# Patient Record
Sex: Male | Born: 1970 | Race: Black or African American | Hispanic: No | Marital: Single | Smoking: Former smoker
Health system: Southern US, Community
[De-identification: ages and names within clinical notes are randomized; demographics above are authoritative.]

## PROBLEM LIST (undated history)

## (undated) DIAGNOSIS — Z972 Presence of dental prosthetic device (complete) (partial): Secondary | ICD-10-CM

---

## 2006-12-03 ENCOUNTER — Emergency Department: Payer: Self-pay | Admitting: Emergency Medicine

## 2011-01-01 ENCOUNTER — Emergency Department: Payer: Self-pay | Admitting: Emergency Medicine

## 2011-04-01 ENCOUNTER — Emergency Department: Payer: Self-pay | Admitting: Emergency Medicine

## 2013-12-12 ENCOUNTER — Emergency Department: Payer: Self-pay | Admitting: Emergency Medicine
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
## 2017-02-10 ENCOUNTER — Encounter: Payer: Self-pay | Admitting: Emergency Medicine

## 2017-02-10 ENCOUNTER — Ambulatory Visit
Admission: EM | Admit: 2017-02-10 | Discharge: 2017-02-10 | Disposition: A | Discharge status: Home / Self Care | Payer: 59 | Attending: Emergency Medicine | Admitting: Emergency Medicine

## 2017-02-10 DIAGNOSIS — R1010 Upper abdominal pain, unspecified: Secondary | ICD-10-CM

## 2017-02-10 DIAGNOSIS — R1011 Right upper quadrant pain: Secondary | ICD-10-CM

## 2017-02-10 MED ORDER — IBUPROFEN 600 MG PO TABS: 600.0000 mg | ORAL_TABLET | Freq: Four times a day (QID) | ORAL | 0 refills | Status: DC | PRN

## 2017-02-10 NOTE — ED Provider Notes (Signed)
HPI  SUBJECTIVE:  Samuel Davis is a 46 y.o. male who presents with 2 days of sharp, intermittent, migratory, nonradiating Right upper abdominal pain.  States while he was pushing boxes, he felt something "tear" across the upper part of his abdomen earlier today.  States that the pain got worse and now it has largely resolved.  It lasted approximately an hour.  He denies abdominal bulging, nausea, vomiting, fevers, trauma to the abdomen.  No change in his workouts/physical activity although he states he does work out daily.  He denies back, shoulder pain, chest pain, shortness of breath.  His right upper quadrant abdominal pain is not associated with eating, fasting, urination, defecation.  It is better with Tylenol.  No aggravating factors.  Upper abdominal pain is not associated, aggravated or alleviated with anything.  Last bowel movement this morning was normal.  He took Tylenol within 6-8 hours of evaluation.  No past medical history of diabetes, hypertension, abdominal surgeries, gallbladder disease, pancreatitis.  States that he drinks 2 beers a day.  No recent binging.  PMD: None.  Patient is concerned about the abdominal pain because his father had colon cancer.  History reviewed. No pertinent past medical history.  History reviewed. No pertinent surgical history.  History reviewed. No pertinent family history.  Social History  Substance Use Topics  . Smoking status: Current Every Day Smoker    Types: Cigarettes  . Smokeless tobacco: Never Used  . Alcohol use No    No current facility-administered medications for this encounter.   Current Outpatient Prescriptions:  .  ibuprofen (ADVIL,MOTRIN) 600 MG tablet, Take 1 tablet (600 mg total) by mouth every 6 (six) hours as needed., Disp: 30 tablet, Rfl: 0  Not on File   ROS  As noted in HPI.   Physical Exam  BP (!) 155/97 (BP Location: Left Arm)   Pulse 75   Temp 98.5 F (36.9 C) (Oral)   Resp 16   Ht 6\' 2"  (1.88 m)   Wt 220  lb (99.8 kg)   SpO2 99%   BMI 28.25 kg/m   Constitutional: Well developed, well nourished, no acute distress Eyes:  EOMI, conjunctiva normal bilaterally HENT: Normocephalic, atraumatic,mucus membranes moist Respiratory: Normal inspiratory effort Cardiovascular: Normal rate GI: nondistended soft, nontender, normal bowel sounds.  No guarding, rebound.  No palpable masses.  Negative McBurney, negative Murphy.  No appreciable hernias.  No evidence of abdominal trauma. Neck: No CVA tenderness skin: No rash, skin intact Musculoskeletal: no deformities Neurologic: Alert & oriented x 3, no focal neuro deficits Psychiatric: Speech and behavior appropriate   ED Course   Medications - No data to display  No orders of the defined types were placed in this encounter.   No results found for this or any previous visit (from the past 24 hour(s)). No results found.  ED Clinical Impression  Pain of upper abdomen   ED Assessment/Plan  Patient does not have any evidence for hernia.  Doubt gallbladder disease.  His abdomen is completely benign.  Suspect musculoskeletal abdominal wall pain because he does work out daily.  Advised him to rest for the next week with no core work or abs, will also start him on ibuprofen 600 mg with 1 g of Tylenol 3-4 times a day as needed for pain.  He will follow-up with a primary care physician of his choice, will provide primary care referral list.  He is to go to the ER if he gets worse.  Discussed MDM, plan  and followup with patient. Discussed sn/sx that should prompt return to the ED. patient agrees with plan.   Meds ordered this encounter  Medications  . ibuprofen (ADVIL,MOTRIN) 600 MG tablet    Sig: Take 1 tablet (600 mg total) by mouth every 6 (six) hours as needed.    Dispense:  30 tablet    Refill:  0    *This clinic note was created using L[employer]. Therefore, there may be occasional mistakes despite careful proofreading.   ?     Melynda Ripple, MD 02/10/17 [phone]

## 2017-02-10 NOTE — ED Triage Notes (Signed)
Patient states that he started having upper abdominal pain while at work today after pushing some boxes.  Patient denies N/V/D.

## 2017-02-10 NOTE — Discharge Instructions (Signed)
600 mg of ibuprofen with 1 g of Tylenol 3-4 times a day as needed.  No core/abdominal work for the next week.  Follow up with a primary care physician of your choice.  See list below.  Go to the ER for the signs and symptoms we discussed.  Here is a list of primary care providers who are taking new patients:  Dr. Otilio Miu, Dr. Adline Potter [address] [place] [address] [place] [address]  [phone]  [facility] [address] [place], [place] [address] [phone]  [facility] [place]  [phone] [place], [place] [address]  Here are clinics/ other resources who will see you if you do not have insurance. Some have certain criteria that you must meet. Call them and find out what they are:  [facility]: [address] [place], [place] [address] Phone: [phone] Hours: First and Third Saturdays of each Month, 9 a.m. - 1 p.m.  [facility]: [address], [place], [place], [place] [address] Phone: [phone] Hours: Tuesday, 4 p.m. - 8 p.m. Thursday, 1 p.m. - 8 p.m. Wednesday, 9 a.m. - [facility] [address] [place], [place] [address] Phone: [phone] Pharmacy Phone Number: [phone] Dental Phone Number: [phone] [place] Help: [phone]  Dental Hours: Monday - Thursday, 8 a.m. - 6 p.m.  [place] [address] [place], [place] [address] Phone: [phone] Pharmacy Phone Number: [phone] [facility] Insurance Help: [phone]  [facility] [address] [place], [place] [address] Phone: [phone] Pharmacy Phone Number: [phone] [facility] Insurance Help: [phone]  [facility] [address] [place], [place] [address] Phone: [phone] [facility] Insurance Help: [phone]   [place] [place], [place] [address] Phone: [phone]  Go to www.goodrx.com to look up your medications. This will give you a list of where you can find your prescriptions at the most affordable prices. Or ask the pharmacist what the cash price is, or if they have any other discount programs available to help make your medication more affordable. This can be less expensive than what you would pay with insurance.

## 2017-11-30 ENCOUNTER — Encounter: Payer: Self-pay | Admitting: Family Medicine

## 2017-11-30 ENCOUNTER — Other Ambulatory Visit: Payer: Self-pay | Admitting: Family Medicine

## 2017-11-30 ENCOUNTER — Ambulatory Visit (INDEPENDENT_AMBULATORY_CARE_PROVIDER_SITE_OTHER): Payer: BLUE CROSS/BLUE SHIELD | Admitting: Family Medicine

## 2017-11-30 VITALS — BP 126/66 | HR 65 | Temp 98.7°F | Resp 16 | Ht 74.0 in | Wt 216.4 lb

## 2017-11-30 DIAGNOSIS — Z7689 Persons encountering health services in other specified circumstances: Secondary | ICD-10-CM | POA: Diagnosis not present

## 2017-11-30 DIAGNOSIS — K625 Hemorrhage of anus and rectum: Secondary | ICD-10-CM | POA: Diagnosis not present

## 2017-11-30 DIAGNOSIS — Z131 Encounter for screening for diabetes mellitus: Secondary | ICD-10-CM

## 2017-11-30 DIAGNOSIS — Z13220 Encounter for screening for lipoid disorders: Secondary | ICD-10-CM

## 2017-11-30 DIAGNOSIS — Z0000 Encounter for general adult medical examination without abnormal findings: Secondary | ICD-10-CM

## 2017-11-30 DIAGNOSIS — Z114 Encounter for screening for human immunodeficiency virus [HIV]: Secondary | ICD-10-CM

## 2017-11-30 NOTE — Patient Instructions (Addendum)
Thank you for coming to the office today.  Keep up the good work  Follow-up as planned for further testing  I am okay to hold off on PSA prostate cancer screening for now as you mentioned the diagnosis for you father was COLON cancer.  Colon Cancer Screening: - For all adults age 46+ routine colon cancer screening is highly recommended.     - Recent guidelines from [place] recommend starting age of 81 - Early detection of colon cancer is important, because often there are no warning signs or symptoms, also if found early usually it can be cured. Late stage is hard to treat. - Special circumstances in patients with early family history of colon cancer, we recommend colonoscopy at a younger age (usually 63 years before the family member was diagnosed).  - Colonoscopy is the best test for colon cancer because it involves direct visualization and immediate treatment (compared to other imaging studies or stool cards to test for blood, that will require you to eventually get a colonoscopy if they are abnormal).   DUE for FASTING BLOOD WORK (no food or drink after midnight before the lab appointment, only water or coffee without cream/sugar on the morning of)  SCHEDULE "Lab Only" visit in the morning at the clinic for lab draw within 1 week  - Make sure Lab Only appointment is at about 1 week before your next appointment, so that results will be available  For Lab Results, once available within 2-3 days of blood draw, you can can log in to MyChart online to view your results and a brief explanation. Also, we can discuss results at next follow-up visit.   Please schedule a Follow-up Appointment to: Return in about 1 week (around 12/07/2017) for Annual Physical (+DRE).  If you have any other questions or concerns, please feel free to call the office or send a message through [facility]. You may also schedule an earlier appointment if necessary.  Additionally, you may be receiving a  survey about your experience at our office within a few days to 1 week by e-mail or mail. We value your feedback.  Nobie Putnam, DO [place]

## 2017-11-30 NOTE — Progress Notes (Signed)
Subjective:    Patient ID: Samuel Davis, male    DOB: December 22, 1970, 47 y.o.   MRN: [id number]  Samuel Davis is a 47 y.o. male presenting on 11/30/2017 for Establish Care (blood in stool onset 4 days) and Hematochezia  Here to establish care with new PCP. No prior PCP for while before.  HPI   Rectal Bleeding / Hematochezia Reports prior episode in past that resolved spontaneously, and now he had new recurrent episode about 2 weeks ago, he had some stomach ache and discomfort and he had noticed some blood in toilet, decent amount of bright red blood in water not mixed in with stool, lasted for 2-3 days with every bowel movement until it eased. Most bowel movements were loose. He does not endorse significant concerns with constipation with hard or dry stool or straining. 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 Currently asymptomatic. No known family history of prostate CA. Defer screening  Colon CA Screening: Never had colonoscopy. Currently w/ hematochezia, see above. known family history of colon CA, father age 55s. Recommended pursuing further exam and screening, he will reconsider. Declines today.   Depression screen [facility] 2/9 11/30/2017  Decreased Interest 0  Down, Depressed, Hopeless 0  PHQ - 2 Score 0    History reviewed. No pertinent past medical history. History reviewed. No  pertinent surgical history. Social History   Socioeconomic History  . Marital status: Single    Spouse name: Not on file  . Number of children: Not on file  . Years of education: [place], [place]  . Highest education level: Associate degree: occupational, Hotel manager, or vocational program  Occupational History  . Occupation: Distribution  Social Needs  . Financial resource strain: Not on file  . Food insecurity:    Worry: Not on file    Inability: Not on file  . Transportation needs:    Medical: Not on file    Non-medical: Not on file  Tobacco Use  . Smoking status: Former Smoker    Packs/day: 0.75    Years: 10.00    Pack years: 7.50    Types: Cigarettes    Last attempt to quit: 09/08/2017    Years since quitting: 0.2  . Smokeless tobacco: Former Systems developer  . Tobacco comment: Quit cold [place]  Substance and Sexual Activity  . Alcohol use: Yes    Alcohol/week: 5.0 standard drinks    Types: 5 Cans of beer per week  . Drug use: Never  . Sexual activity: Not on file  Lifestyle  . Physical activity:    Days per week: Not on file    Minutes per session: Not on file  . Stress: Not on file  Relationships  . Social connections:    Talks on phone: Not on file    Gets together: Not on file    Attends religious service: Not on file    Active member of club or organization: Not on file  Attends meetings of clubs or organizations: Not on file    Relationship status: Not on file  . Intimate partner violence:    Fear of current or ex partner: Not on file    Emotionally abused: Not on file    Physically abused: Not on file    Forced sexual activity: Not on file  Other Topics Concern  . Not on file  Social History Narrative  . Not on file   Family History  Problem Relation Age of Onset  . Colon cancer Father 70       possibly earlier, but not diagnosed  . Hypertension Sister    Current Outpatient Medications on File Prior to Visit  Medication Sig  . ibuprofen (ADVIL,MOTRIN) 600  MG tablet Take 1 tablet (600 mg total) by mouth every 6 (six) hours as needed.   No current facility-administered medications on file prior to visit.     Review of Systems Per HPI unless specifically indicated above     Objective:    BP 126/66   Pulse 65   Temp 98.7 F (37.1 C) (Oral)   Resp 16   Ht 6\' 2"  (1.88 m)   Wt 216 lb 6.4 oz (98.2 kg)   BMI 27.78 kg/m   Wt Readings from Last 3 Encounters:  11/30/17 216 lb 6.4 oz (98.2 kg)  02/10/17 220 lb (99.8 kg)    Physical Exam  Constitutional: He is oriented to person, place, and time. He appears well-developed and well-nourished. No distress.  Well-appearing, comfortable, cooperative  HENT:  Head: Normocephalic and atraumatic.  Mouth/Throat: Oropharynx is clear and moist.  Eyes: Conjunctivae are normal. Right eye exhibits no discharge. Left eye exhibits no discharge.  Cardiovascular: Normal rate.  Pulmonary/Chest: Effort normal.  Genitourinary:  Genitourinary Comments: Declined DRE today  Musculoskeletal: He exhibits no edema.  Neurological: He is alert and oriented to person, place, and time.  Skin: Skin is warm and dry. No rash noted. He is not diaphoretic. No erythema.  Psychiatric: He has a normal mood and affect. His behavior is normal.  Well groomed, good eye contact, normal speech and thoughts  Nursing note and vitals reviewed.  No results found for this or any previous visit.    Assessment & Plan:   Problem List Items Addressed This Visit    None    Visit Diagnoses    Rectal bleeding    -  Primary  Clinically with hematochezia / BRBPR episode now lasting few days and since resolved, without significant other GI red flags or other assoc symptoms. Prior episode in past that is resolved. - Remains uncertain diagnosis. Still most likely is hemorrhoidal bleeding possibly internal given lack of abdominal pain and constipation. - Concern fam history of colon cancer age 29 father, at risk until proven  otherwise  Plan Recommended pursuing further eval - DRE today but he declined, reconsider in 1 week at physical - Also recommend pursuing colon cancer screening given his fam history, prefer refer to GI for colonoscopy, he will reconsider as well, remains interested but not ready today to pursue - Follow-up if worsening or new symptoms    Encounter to establish care with new doctor          No orders of the defined types were placed in this encounter.   Follow up plan: Return in about 1 week (around 12/07/2017) for Annual Physical (+DRE).   Future labs ordered for 12/07/17, he will return same day for physical.  Nobie Putnam, DO  [facility] 11/30/2017, 10:52 PM

## 2017-12-06 ENCOUNTER — Other Ambulatory Visit: Payer: Self-pay

## 2017-12-06 DIAGNOSIS — Z13220 Encounter for screening for lipoid disorders: Secondary | ICD-10-CM

## 2017-12-06 DIAGNOSIS — Z0000 Encounter for general adult medical examination without abnormal findings: Secondary | ICD-10-CM

## 2017-12-06 DIAGNOSIS — K625 Hemorrhage of anus and rectum: Secondary | ICD-10-CM

## 2017-12-06 DIAGNOSIS — Z114 Encounter for screening for human immunodeficiency virus [HIV]: Secondary | ICD-10-CM

## 2017-12-06 DIAGNOSIS — Z131 Encounter for screening for diabetes mellitus: Secondary | ICD-10-CM

## 2017-12-07 ENCOUNTER — Ambulatory Visit (INDEPENDENT_AMBULATORY_CARE_PROVIDER_SITE_OTHER): Payer: BLUE CROSS/BLUE SHIELD | Admitting: Family Medicine

## 2017-12-07 ENCOUNTER — Encounter: Payer: Self-pay | Admitting: Family Medicine

## 2017-12-07 ENCOUNTER — Other Ambulatory Visit: Payer: BLUE CROSS/BLUE SHIELD

## 2017-12-07 ENCOUNTER — Other Ambulatory Visit: Payer: Self-pay | Admitting: Family Medicine

## 2017-12-07 VITALS — BP 130/59 | HR 61 | Temp 98.0°F | Resp 16 | Ht 74.0 in | Wt 216.0 lb

## 2017-12-07 DIAGNOSIS — K644 Residual hemorrhoidal skin tags: Secondary | ICD-10-CM

## 2017-12-07 DIAGNOSIS — Z0000 Encounter for general adult medical examination without abnormal findings: Secondary | ICD-10-CM | POA: Diagnosis not present

## 2017-12-07 NOTE — Progress Notes (Signed)
Subjective:    Patient ID: Samuel Davis, male    DOB: 08/06/1970, 47 y.o.   MRN: [id number]  Samuel Davis is a 47 y.o. male presenting on 12/07/2017 for Annual Exam   HPI   Here for Annual Physical, he has had fasting lab draw completed today. Lab results are still pending.  Lifestyle Diet: Tries to eat balanced, fruit / vegetable daily, drinks only water and some juice, not drinking soda Exercise: Gym does workout everyday with cardio, chest, legs, abs etc He is sleeping well, no concerns.  FOLLOW-UP Rectal Bleeding / External Hemorrhoid Last visit discussed this problem, prior episode few weeks ago, since resolved. No prior diagnosis. He states has not had any further bleeding episodes since that time. He does not endorse significant concerns with constipation with hard or dry stool or straining. - Family history of colon cancer, father age 86s  Lifestyle Diet: Tries to eat balanced, fruit / vegetable daily, drinks only water and some juice, not drinking soda Exercise: Gym does workout everyday with cardio, chest, legs, abs etc   Health Maintenance:  Prostate CA Screening: No prior prostate CA screening. Currently asymptomatic. No known family history of prostate CA. Defer screening at this time.  Colon CA Screening: Never had colonoscopy. Currently w/ hematochezia, see above. known family history of colon CA, father age 33s. Recommended pursuing further exam and screening, he will reconsider. Declines today.  Due for Flu Vaccine, will return when in stock  Depression screen [facility] 2/9 12/07/2017 11/30/2017  Decreased Interest 0 0  Down, Depressed, Hopeless 0 0  PHQ - 2 Score 0 0    History reviewed. No pertinent past medical history. History reviewed. No pertinent surgical history. Social History   Socioeconomic History  . Marital status: Single    Spouse name: Not on file  . Number of children: Not on file  . Years of education: [place], [place]  . Highest education  level: Associate degree: occupational, Hotel manager, or vocational program  Occupational History  . Occupation: Distribution  Social Needs  . Financial resource strain: Not on file  . Food insecurity:    Worry: Not on file    Inability: Not on file  . Transportation needs:    Medical: Not on file    Non-medical: Not on file  Tobacco Use  . Smoking status: Former Smoker    Packs/day: 0.75    Years: 10.00    Pack years: 7.50    Types: Cigarettes    Last attempt to quit: 09/08/2017    Years since quitting: 0.2  . Smokeless tobacco: Former Systems developer  . Tobacco comment: Quit cold [place]  Substance and Sexual Activity  . Alcohol use: Yes    Alcohol/week: 5.0 standard drinks    Types: 5 Cans of beer per week  . Drug use: Never  . Sexual activity: Not on file  Lifestyle  . Physical activity:    Days per week: Not on file    Minutes per session: Not on file  . Stress: Not on file  Relationships  . Social connections:    Talks on phone: Not on file    Gets together: Not on file    Attends religious service: Not on file    Active member of club or organization: Not on file    Attends meetings of clubs or organizations: Not on file    Relationship status: Not on file  . Intimate partner violence:    Fear of current or ex partner: Not  on file    Emotionally abused: Not on file    Physically abused: Not on file    Forced sexual activity: Not on file  Other Topics Concern  . Not on file  Social History Narrative  . Not on file   Family History  Problem Relation Age of Onset  . Colon cancer Father 61       possibly earlier, but not diagnosed  . Hypertension Sister    Current Outpatient Medications on File Prior to Visit  Medication Sig  . ibuprofen (ADVIL,MOTRIN) 600 MG tablet Take 1 tablet (600 mg total) by mouth every 6 (six) hours as needed.   No current facility-administered medications on file prior to visit.     Review of Systems Per HPI unless specifically indicated  above     Objective:    BP (!) 130/59   Pulse 61   Temp 98 F (36.7 C) (Oral)   Resp 16   Ht 6\' 2"  (1.88 m)   Wt 216 lb (98 kg)   BMI 27.73 kg/m   Wt Readings from Last 3 Encounters:  12/07/17 216 lb (98 kg)  11/30/17 216 lb 6.4 oz (98.2 kg)  02/10/17 220 lb (99.8 kg)    Physical Exam  Constitutional: He is oriented to person, place, and time. He appears well-developed and well-nourished. No distress.  Well-appearing, comfortable, cooperative, muscular build  HENT:  Head: Normocephalic and atraumatic.  Mouth/Throat: Oropharynx is clear and moist.  Frontal / maxillary sinuses non-tender. Nares patent without purulence or edema. Bilateral TMs clear without erythema, effusion or bulging. Oropharynx clear without erythema, exudates, edema or asymmetry.  Eyes: Pupils are equal, round, and reactive to light. Conjunctivae and EOM are normal. Right eye exhibits no discharge. Left eye exhibits no discharge.  Neck: Normal range of motion. Neck supple. No thyromegaly present.  Cardiovascular: Normal rate, regular rhythm, normal heart sounds and intact distal pulses.  No murmur heard. Pulmonary/Chest: Effort normal and breath sounds normal. No respiratory distress. He has no wheezes. He has no rales.  Abdominal: Soft. Bowel sounds are normal. He exhibits no distension and no mass. There is no tenderness.  Genitourinary:  Genitourinary Comments: Rectal: External exam with notable non inflamed non swollen external hemorrhoidal tissue moderately sized posterior aspect, without obvious anal fissure, no active bleeding. Did not pursue DRE today based on external findings and patient preference.  Musculoskeletal: Normal range of motion. He exhibits no edema or tenderness.  Upper / Lower Extremities: - Normal muscle tone, strength bilateral upper extremities 5/5, lower extremities 5/5  Lymphadenopathy:    He has no cervical adenopathy.  Neurological: He is alert and oriented to person, place, and  time.  Distal sensation intact to light touch all extremities  Skin: Skin is warm and dry. No rash noted. He is not diaphoretic. No erythema.  Psychiatric: He has a normal mood and affect. His behavior is normal.  Well groomed, good eye contact, normal speech and thoughts  Nursing note and vitals reviewed.  No results found for this or any previous visit.    Assessment & Plan:   Problem List Items Addressed This Visit    External hemorrhoids without complication    Stable chronic external posterior hemorrhoid. Uncomplicated. Not inflamed or thrombosed. Likely source of his rectal bleeding episode in past. Declined DRE - uncertain if also internal hemorrhoids. No evidence of anal fissure or any perineal problems, no sign of erythema or cellulitis.  Plan: 1. Reassurance 2. Recommend to avoid constipation  and straining, recommend high fiber diet, improve hydration 3. Still advise that he should pursue routine colon cancer screening age appropriate, this does not limit that  Reviewed return criteria if not improving, also advised that if significant worsening given location and size of hemorrhoidal tissue, may need General Surgery evaluation and management       Other Visit Diagnoses    Annual physical exam    -  Primary    Updated Health Maintenance information - Return for Flu vaccine Reviewed recent lab results with patient Encouraged improvement to lifestyle with diet and exercise  Pending lab result review will release to patient.  No orders of the defined types were placed in this encounter.   Follow up plan: Return in about 1 year (around 12/08/2018) for Annual Physical.  Future labs ordered for 12/06/18  Nobie Putnam, [facility] 12/07/2017, 1:01 PM

## 2017-12-07 NOTE — Assessment & Plan Note (Signed)
Stable chronic external posterior hemorrhoid. Uncomplicated. Not inflamed or thrombosed. Likely source of his rectal bleeding episode in past. Declined DRE - uncertain if also internal hemorrhoids. No evidence of anal fissure or any perineal problems, no sign of erythema or cellulitis.  Plan: 1. Reassurance 2. Recommend to avoid constipation and straining, recommend high fiber diet, improve hydration 3. Still advise that he should pursue routine colon cancer screening age appropriate, this does not limit that  Reviewed return criteria if not improving, also advised that if significant worsening given location and size of hemorrhoidal tissue, may need General Surgery evaluation and management

## 2017-12-07 NOTE — Patient Instructions (Addendum)
Thank you for coming to the office today.  Please schedule and return for a NURSE ONLY VISIT for VACCINE - Approximately around October 2019 - Need Flu Vaccine  You have an external hemorrhoid, which involves swollen veins on your rectum - if it flares up it can be very sensitive and causes your severe pain. You may experience worsening pain and bleeding with bright red blood if the hemorrhoid develops a superficial blood clot. Also you may have deeper internal hemorrhoids that can cause bleeding without as much pain.  Try high fiber diet to prevent and may try metamucil or fiber gummy supplement to avoid constipation and straining to avoid flare up of hemorrhoid  If you need medicine to calm down pain, let me know or come back as soon as it develops - May recommend suppository for pain relief or OTC medications such as Preparation H or Witch Hazel - Try the warm bathtub soak 1-2 times daily for next week if you can, or can try the [facility] for just your bottom - Try to stay well hydrated, avoid constipation and straining, eat a high fiber diet  If you get significant worsening pain, rectal bleeding, or not responding to treatment, please notify our office and we will anticipate on an urgent referral to General Surgery office as you may need an office procedure to resolve hemorrhoidal tissue, this is most successful if it is early in the course within 24-72 hours of acute worsening pain and bleeding.  Stay tuned for lab results next week. I will be out of office on Tuesday, if question can call.  DUE for FASTING BLOOD WORK (no food or drink after midnight before the lab appointment, only water or coffee without cream/sugar on the morning of)  SCHEDULE "Lab Only" visit in the morning at the clinic for lab draw in 1 YEAR  - Make sure Lab Only appointment is at about 1 week before your next appointment, so that results will be available  For Lab Results, once available within 2-3 days of  blood draw, you can can log in to MyChart online to view your results and a brief explanation. Also, we can discuss results at next follow-up visit.  Regardless of hemorrhoid.  Colon Cancer Screening: REGARDLESS OF HEMORRHOID - I still recommend colon cancer screening between now and age 51 - For all adults age 65+ routine colon cancer screening is highly recommended.     - Recent guidelines from [place] recommend starting age of 37 - Early detection of colon cancer is important, because often there are no warning signs or symptoms, also if found early usually it can be cured. Late stage is hard to treat.  Please schedule a Follow-up Appointment to: Return in about 1 year (around 12/08/2018) for Annual Physical.  If you have any other questions or concerns, please feel free to call the office or send a message through [place]. You may also schedule an earlier appointment if necessary.  Additionally, you may be receiving a survey about your experience at our office within a few days to 1 week by e-mail or mail. We value your feedback.  Nobie Putnam, DO [facility], [facility]   High-Fiber Diet Fiber, also called dietary fiber, is a type of carbohydrate found in fruits, vegetables, whole grains, and beans. A high-fiber diet can have many health benefits. Your health care provider may recommend a high-fiber diet to help:  Prevent constipation. Fiber can make your bowel movements more regular.  Lower your cholesterol.  Relieve hemorrhoids, uncomplicated diverticulosis, or irritable bowel syndrome.  Prevent overeating as part of a weight-loss plan.  Prevent heart disease, type 2 diabetes, and certain cancers.  What is my plan? The recommended daily intake of fiber includes:  38 grams for men under age 3.  6 grams for men over age 52.  78 grams for women under age 50.  75 grams for women over age 23.  You can get the recommended daily intake  of dietary fiber by eating a variety of fruits, vegetables, grains, and beans. Your health care provider may also recommend a fiber supplement if it is not possible to get enough fiber through your diet. What do I need to know about a high-fiber diet?  Fiber supplements have not been widely studied for their effectiveness, so it is better to get fiber through food sources.  Always check the fiber content on thenutrition facts label of any prepackaged food. Look for foods that contain at least 5 grams of fiber per serving.  Ask your dietitian if you have questions about specific foods that are related to your condition, especially if those foods are not listed in the following section.  Increase your daily fiber consumption gradually. Increasing your intake of dietary fiber too quickly may cause bloating, cramping, or gas.  Drink plenty of water. Water helps you to digest fiber. What foods can I eat? Grains Whole-grain breads. Multigrain cereal. Oats and oatmeal. Brown rice. Barley. Bulgur wheat. [place]. Bran muffins. Popcorn. Rye wafer crackers. Vegetables Sweet potatoes. Spinach. Kale. Artichokes. Cabbage. Broccoli. Green peas. Carrots. Squash. Fruits Berries. Pears. Apples. Oranges. Avocados. Prunes and raisins. Dried figs. Meats and Other Protein Sources [place], kidney, pinto, and soy beans. Split peas. Lentils. Nuts and seeds. Dairy Fiber-fortified yogurt. Beverages Fiber-fortified soy milk. Fiber-fortified orange juice. Other Fiber bars. The items listed above may not be a complete list of recommended foods or beverages. Contact your dietitian for more options. What foods are not recommended? Grains White bread. Pasta made with refined flour. White rice. Vegetables Fried potatoes. Canned vegetables. Well-cooked vegetables. Fruits Fruit juice. Cooked, strained fruit. Meats and Other Protein Sources Fatty cuts of meat. Fried Sales executive or fried fish. Dairy Milk. Yogurt. Cream  cheese. Sour cream. Beverages Soft drinks. Other Cakes and pastries. Butter and oils. The items listed above may not be a complete list of foods and beverages to avoid. Contact your dietitian for more information. What are some tips for including high-fiber foods in my diet?  Eat a wide variety of high-fiber foods.  Make sure that half of all grains consumed each day are whole grains.  Replace breads and cereals made from refined flour or white flour with whole-grain breads and cereals.  Replace white rice with brown rice, bulgur wheat, or millet.  Start the day with a breakfast that is high in fiber, such as a cereal that contains at least 5 grams of fiber per serving.  Use beans in place of meat in soups, salads, or pasta.  Eat high-fiber snacks, such as berries, raw vegetables, nuts, or popcorn. This information is not intended to replace advice given to you by your health care provider. Make sure you discuss any questions you have with your health care provider. Document Released: 03/27/2005 Document Revised: 09/02/2015 Document Reviewed: 09/09/2013 Elsevier Interactive Patient Education  [employer].

## 2017-12-08 LAB — COMPLETE METABOLIC PANEL WITH GFR
AG Ratio: 2.1 (calc) (ref 1.0–2.5)
ALBUMIN MSPROF: 4.5 g/dL (ref 3.6–5.1)
ALKALINE PHOSPHATASE (APISO): 42 U/L (ref 40–115)
ALT: 32 U/L (ref 9–46)
AST: 33 U/L (ref 10–40)
BUN: 17 mg/dL (ref 7–25)
CO2: 27 mmol/L (ref 20–32)
CREATININE: 0.98 mg/dL (ref 0.60–1.35)
Calcium: 9.3 mg/dL (ref 8.6–10.3)
Chloride: 104 mmol/L (ref 98–110)
GFR, EST AFRICAN AMERICAN: 107 mL/min/{1.73_m2} (ref >=60)
GFR, Est Non African American: 92 mL/min/{1.73_m2} (ref >=60)
GLUCOSE: 108 mg/dL — AB (ref 65–99)
Globulin: 2.1 g/dL (calc) (ref 1.9–3.7)
Potassium: 4.4 mmol/L (ref 3.5–5.3)
Sodium: 138 mmol/L (ref 135–146)
TOTAL PROTEIN: 6.6 g/dL (ref 6.1–8.1)
Total Bilirubin: 0.5 mg/dL (ref 0.2–1.2)

## 2017-12-08 LAB — CBC WITH DIFFERENTIAL/PLATELET
BASOS ABS: 32 {cells}/uL (ref 0–200)
Basophils Relative: 0.9 %
EOS ABS: 172 {cells}/uL (ref 15–500)
Eosinophils Relative: 4.9 %
HCT: 41.6 % (ref 38.5–50.0)
Hemoglobin: 12.5 g/dL — ABNORMAL LOW (ref 13.2–17.1)
Lymphs Abs: 1362 cells/uL (ref 850–3900)
MCH: 23.2 pg — AB (ref 27.0–33.0)
MCHC: 30 g/dL — AB (ref 32.0–36.0)
MCV: 77.3 fL — AB (ref 80.0–100.0)
MONOS PCT: 8.4 %
NEUTROS PCT: 46.9 %
Neutro Abs: 1642 cells/uL (ref 1500–7800)
RBC: 5.38 10*6/uL (ref 4.20–5.80)
RDW: 15.1 % — AB (ref 11.0–15.0)
TOTAL LYMPHOCYTE: 38.9 %
WBC mixed population: 294 cells/uL (ref 200–950)
WBC: 3.5 10*3/uL — ABNORMAL LOW (ref 3.8–10.8)

## 2017-12-08 LAB — HEMOGLOBIN A1C
Hgb A1c MFr Bld: 5.7 % of total Hgb — ABNORMAL HIGH (ref <5.7)
Mean Plasma Glucose: 117 (calc)
eAG (mmol/L): 6.5 (calc)

## 2017-12-08 LAB — LIPID PANEL
Cholesterol: 254 mg/dL — ABNORMAL HIGH (ref <200)
HDL: 81 mg/dL (ref >40)
LDL Cholesterol (Calc): 160 mg/dL (calc) — ABNORMAL HIGH
Non-HDL Cholesterol (Calc): 173 mg/dL (calc) — ABNORMAL HIGH (ref <130)
Total CHOL/HDL Ratio: 3.1 (calc) (ref <5.0)
Triglycerides: 42 mg/dL (ref <150)

## 2017-12-08 LAB — HIV ANTIBODY (ROUTINE TESTING W REFLEX): HIV 1&2 Ab, 4th Generation: NONREACTIVE

## 2017-12-12 ENCOUNTER — Encounter: Payer: Self-pay | Admitting: Family Medicine

## 2017-12-12 ENCOUNTER — Telehealth: Payer: Self-pay | Admitting: Family Medicine

## 2017-12-12 NOTE — Telephone Encounter (Signed)
Called patient to review lab results.  He will call us to schedule for 6 months for PreDM A1c check and lifestyle.  He requested mail copy of diet information for pre-diabetes.  He thinks may have had history of low iron before but not sure, no available lab test results from past. He is asymptomatic from this and not endorsing other concerns, he agrees to monitor this as planned and re-check in 1 year. Sooner if symptoms of worsening anemia or bleeding or other concerns.  Nobie Putnam, DO [facility] 12/12/2017, 5:44 PM

## 2017-12-12 NOTE — Telephone Encounter (Signed)
Pt called for labs results. Pt call back # is [phone]

## 2017-12-28 ENCOUNTER — Telehealth: Payer: Self-pay | Admitting: Family Medicine

## 2017-12-31 ENCOUNTER — Ambulatory Visit (INDEPENDENT_AMBULATORY_CARE_PROVIDER_SITE_OTHER): Payer: BLUE CROSS/BLUE SHIELD

## 2017-12-31 DIAGNOSIS — Z23 Encounter for immunization: Secondary | ICD-10-CM | POA: Diagnosis not present

## 2018-01-01 NOTE — Telephone Encounter (Signed)
error 

## 2018-02-06 ENCOUNTER — Encounter: Payer: Self-pay | Admitting: Family Medicine

## 2018-02-06 ENCOUNTER — Ambulatory Visit (INDEPENDENT_AMBULATORY_CARE_PROVIDER_SITE_OTHER): Payer: BLUE CROSS/BLUE SHIELD | Admitting: Family Medicine

## 2018-02-06 VITALS — BP 146/88 | HR 77 | Temp 98.6°F | Resp 16 | Ht 74.0 in | Wt 210.0 lb

## 2018-02-06 DIAGNOSIS — L509 Urticaria, unspecified: Secondary | ICD-10-CM

## 2018-02-06 MED ORDER — TRIAMCINOLONE ACETONIDE 0.1 % EX CREA: 1.0000 "application " | TOPICAL_CREAM | Freq: Two times a day (BID) | CUTANEOUS | 0 refills | Status: DC | PRN

## 2018-02-06 NOTE — Progress Notes (Signed)
Subjective:    Patient ID: Samuel Davis, male    DOB: [birth year], 47 y.o.   MRN: [id number]  Samuel Davis is a 47 y.o. male presenting on 02/06/2018 for Mass (as per patient more on abdominal LLQ onset 2 week)   HPI   LLQ Abdominal Subcutaneous Nodule Reports symptoms onset about 2 weeks ago, with thought he had a "bump" on skin and it was itching and he noticed a deeper "nodule" and wanted to get it checked out. Recent history exposure to bedbugs at friend's house he went to [facility] 2-3 weeks ago and was treated with antibiotic injection, and had some hive reaction, caused similar areas of swelling - He denies any actual abdominal symptoms, no abdominal pain, nausea vomiting, bowel changes, blood in stool or hematochezia episodes - No other mass or lipoma diagnosed before - He thinks the spot is improving now, he has not had any more problems with bug bites and itching has resolved - He does request an anti itch or topical cream Denies any fevers chills unintentional weight loss  Health Maintenance: UTD Flu Vaccine  Depression screen [facility] 2/9 02/06/2018 12/07/2017 11/30/2017  Decreased Interest 0 0 0  Down, Depressed, Hopeless 0 0 0  PHQ - 2 Score 0 0 0    Social History   Tobacco Use  . Smoking status: Former Smoker    Packs/day: 0.75    Years: 10.00    Pack years: 7.50    Types: Cigarettes    Last attempt to quit: 09/08/2017    Years since quitting: 0.4  . Smokeless tobacco: Former Systems developer  . Tobacco comment: Quit cold [place]  Substance Use Topics  . Alcohol use: Yes    Alcohol/week: 5.0 standard drinks    Types: 5 Cans of beer per week  . Drug use: Never    Review of Systems Per HPI unless specifically indicated above     Objective:    BP (!) 146/88   Pulse 77   Temp 98.6 F (37 C) (Oral)   Resp 16   Ht 6\' 2"  (1.88 m)   Wt 210 lb (95.3 kg)   BMI 26.96 kg/m   Wt Readings from Last 3 Encounters:  02/06/18 210 lb (95.3 kg)  12/07/17 216 lb (98 kg)  11/30/17  216 lb 6.4 oz (98.2 kg)    Physical Exam  Constitutional: He is oriented to person, place, and time. He appears well-developed and well-nourished. No distress.  Well-appearing, comfortable, cooperative  HENT:  Head: Normocephalic and atraumatic.  Mouth/Throat: Oropharynx is clear and moist.  Eyes: Conjunctivae are normal. Right eye exhibits no discharge. Left eye exhibits no discharge.  Cardiovascular: Normal rate.  Pulmonary/Chest: Effort normal.  Abdominal: Soft. Bowel sounds are normal. He exhibits mass (small palpable < 1 cm nodular density appears within subcutaneous tissue of left lower region of abdomen, minimal tender no skin involvement). He exhibits no distension. There is no tenderness. There is no guarding.  Musculoskeletal: He exhibits no edema.  Neurological: He is alert and oriented to person, place, and time.  Skin: Skin is warm and dry. No rash (Resolved - one area of hyperpigmentation R side from prior bite) noted. He is not diaphoretic. No erythema.  Psychiatric: He has a normal mood and affect. His behavior is normal.  Well groomed, good eye contact, normal speech and thoughts  Nursing note and vitals reviewed.         Assessment & Plan:   Problem List Items Addressed This  Visit    None    Visit Diagnoses    Urticarial rash    -  Primary   Relevant Medications   triamcinolone cream (KENALOG) 0.1 %      Clinically with recent urticarial rash from suspected bug bites some on abdomen, he has scratched area and seems to have triggered persistent local swelling reaction, now improved and only some residual deeper palpable nodular density.   Plan Reassurance, does not feel like concerning abdominal mass at this time. Seems to be limited to subcutaneous tissue. Trial of Triamcinolone to see if reduce local skin reaction from previous hive if that helps reduce this spot Follow-up progress if continues to improve, then no further treatment If not improving then may  consider return for re-evaluation, possible ultrasound imaging  Meds ordered this encounter  Medications  . triamcinolone cream (KENALOG) 0.1 %    Sig: Apply 1 application topically 2 (two) times daily as needed.    Dispense:  30 g    Refill:  0    Follow up plan: Return if symptoms worsen or fail to improve, for  skin reaction.  Nobie Putnam, DO [facility] 02/06/2018, 4:35 PM

## 2018-02-06 NOTE — Patient Instructions (Addendum)
Thank you for coming to the office today.  Try Triamcinolone cream 1-2 times a day for 1-2 weeks - use the topical steroid as a spot treatment, help reduce itch, swelling, local reaction, should help it resolve.  If it is not 100% resolved but improving, then no need to do anything else at this time.  May have been an already existing Lipoma or other benign or normal growth within this layer of the skin.  If worsening - inc size, change in shape, other spots, redness, worse pain, fevers or other unusual symptoms then let me know.   Please schedule a Follow-up Appointment to: Return if symptoms worsen or fail to improve, for  skin reaction.  If you have any other questions or concerns, please feel free to call the office or send a message through [place]. You may also schedule an earlier appointment if necessary.  Additionally, you may be receiving a survey about your experience at our office within a few days to 1 week by e-mail or mail. We value your feedback.  Nobie Putnam, DO [place]

## 2018-02-07 ENCOUNTER — Encounter: Payer: Self-pay | Admitting: Family Medicine

## 2018-05-04 ENCOUNTER — Emergency Department: Payer: BLUE CROSS/BLUE SHIELD

## 2018-05-04 ENCOUNTER — Emergency Department
Admission: EM | Admit: 2018-05-04 | Discharge: 2018-05-04 | Disposition: A | Discharge status: Home / Self Care | Payer: BLUE CROSS/BLUE SHIELD | Attending: Emergency Medicine | Admitting: Emergency Medicine

## 2018-05-04 ENCOUNTER — Other Ambulatory Visit: Payer: Self-pay

## 2018-05-04 ENCOUNTER — Encounter: Payer: Self-pay | Admitting: Emergency Medicine

## 2018-05-04 DIAGNOSIS — S62221A Displaced Rolando's fracture, right hand, initial encounter for closed fracture: Secondary | ICD-10-CM | POA: Insufficient documentation

## 2018-05-04 DIAGNOSIS — Y939 Activity, unspecified: Secondary | ICD-10-CM | POA: Insufficient documentation

## 2018-05-04 DIAGNOSIS — S66801A Unspecified injury of other specified muscles, fascia and tendons at wrist and hand level, right hand, initial encounter: Secondary | ICD-10-CM | POA: Diagnosis not present

## 2018-05-04 DIAGNOSIS — Y999 Unspecified external cause status: Secondary | ICD-10-CM | POA: Diagnosis not present

## 2018-05-04 DIAGNOSIS — Y929 Unspecified place or not applicable: Secondary | ICD-10-CM | POA: Diagnosis not present

## 2018-05-04 DIAGNOSIS — Z79899 Other long term (current) drug therapy: Secondary | ICD-10-CM | POA: Insufficient documentation

## 2018-05-04 DIAGNOSIS — S62171A Displaced fracture of trapezium [larger multangular], right wrist, initial encounter for closed fracture: Secondary | ICD-10-CM | POA: Insufficient documentation

## 2018-05-04 DIAGNOSIS — S6991XA Unspecified injury of right wrist, hand and finger(s), initial encounter: Secondary | ICD-10-CM | POA: Diagnosis present

## 2018-05-04 DIAGNOSIS — Z87891 Personal history of nicotine dependence: Secondary | ICD-10-CM | POA: Insufficient documentation

## 2018-05-04 DIAGNOSIS — X58XXXA Exposure to other specified factors, initial encounter: Secondary | ICD-10-CM | POA: Diagnosis not present

## 2018-05-04 MED ORDER — TRAMADOL HCL 50 MG PO TABS: 50.0000 mg | ORAL_TABLET | Freq: Four times a day (QID) | ORAL | 0 refills | Status: DC | PRN

## 2018-05-04 MED ORDER — MELOXICAM 15 MG PO TABS: 15.0000 mg | ORAL_TABLET | Freq: Every day | ORAL | 0 refills | Status: DC

## 2018-05-04 NOTE — ED Provider Notes (Signed)
[facility] Emergency Department Provider Note ____________________________________________  Time seen: Approximately 11:00 AM  I have reviewed the triage vital signs and the nursing notes.   HISTORY  Chief Complaint Hand Pain    HPI Samuel Davis is a 48 y.o. male who presents to the emergency department for evaluation and treatment of right hand pain after injury yesterday. Very little details provided other than he "smashed it." Pain and swelling have increased overnight. No alleviating measures attempted prior to arrival.  History reviewed. No pertinent past medical history.  Patient Active Problem List   Diagnosis Date Noted  . External hemorrhoids without complication 81/44/8185    History reviewed. No pertinent surgical history.  Prior to Admission medications   Medication Sig Start Date End Date Taking? Authorizing Provider  ibuprofen (ADVIL,MOTRIN) 600 MG tablet Take 1 tablet (600 mg total) by mouth every 6 (six) hours as needed. 02/10/17   Melynda Ripple, MD  meloxicam (MOBIC) 15 MG tablet Take 1 tablet (15 mg total) by mouth daily. 05/04/18   Aylissa Heinemann, Johnette Abraham B, FNP  traMADol (ULTRAM) 50 MG tablet Take 1 tablet (50 mg total) by mouth every 6 (six) hours as needed. 05/04/18   Garyn Waguespack, Johnette Abraham B, FNP  triamcinolone cream (KENALOG) 0.1 % Apply 1 application topically 2 (two) times daily as needed. 02/06/18   Olin Hauser, DO    Allergies Patient has no known allergies.  Family History  Problem Relation Age of Onset  . Colon cancer Father 54       possibly earlier, but not diagnosed  . Hypertension Sister     Social History Social History   Tobacco Use  . Smoking status: Former Smoker    Packs/day: 0.75    Years: 10.00    Pack years: 7.50    Types: Cigarettes    Last attempt to quit: 09/08/2017    Years since quitting: 0.6  . Smokeless tobacco: Former Systems developer  . Tobacco comment: Quit cold [place]  Substance Use Topics  . Alcohol  use: Not Currently    Alcohol/week: 5.0 standard drinks    Types: 5 Cans of beer per week  . Drug use: Never    Review of Systems Constitutional: Negative for fever. Cardiovascular: Negative for chest pain. Respiratory: Negative for shortness of breath. Musculoskeletal: Positive for right hand pain. Skin: Positive for right hand swelling. No open wounds.   Neurological: Negative for decrease in sensation  ____________________________________________   PHYSICAL EXAM:  VITAL SIGNS: ED Triage Vitals  Enc Vitals Group     BP 05/04/18 0916 (!) 148/94     Pulse Rate 05/04/18 0916 70     Resp 05/04/18 0916 16     Temp 05/04/18 0916 98.7 F (37.1 C)     Temp Source 05/04/18 0916 Oral     SpO2 05/04/18 0916 99 %     Weight 05/04/18 0919 211 lb (95.7 kg)     Height 05/04/18 0916 6\' 2"  (1.88 m)     Head Circumference --      Peak Flow --      Pain Score 05/04/18 0916 6     Pain Loc --      Pain Edu? --      Excl. in [place]? --     Constitutional: Alert and oriented. Well appearing and in no acute distress. Eyes: Conjunctivae are clear without discharge or drainage Head: Atraumatic Neck: Supple Respiratory: No cough. Respirations are even and unlabored. Musculoskeletal: Limited ROM of the right thumb.  Focal tenderness over the MCP and scaphoid area. Neurologic: Motor and sensory function of the right hand is intact.  Skin: No open wound over the right hand.  Psychiatric: Tearful. Emphatically denies SI or HI.  ____________________________________________   LABS (all labs ordered are listed, but only abnormal results are displayed)  Labs Reviewed - No data to display ____________________________________________  RADIOLOGY  Displaced/comminuted fracture at the base of the right first metacarpal with impaction. Slight radial subluxation of the first metacarpal bone suspicious for ligamentous injury. Sling irregularity at the distal margin of the underlying trapezium,  suspicious for mildly displaced avulsion fracture. ____________________________________________   PROCEDURES  .Splint Application Date/Time: 07/24/6061 11:10 AM Performed by: Victorino Dike, FNP Authorized by: Victorino Dike, FNP   Consent:    Consent obtained:  Verbal   Consent given by:  Patient   Risks discussed:  Pain and swelling Procedure details:    Laterality:  Right   Location:  Hand   Hand:  R hand   Splint type:  Volar short arm and radial gutter   Supplies:  Cotton padding, Ortho-Glass and elastic bandage Post-procedure details:    Pain:  Unchanged   Sensation:  Normal   Patient tolerance of procedure:  Tolerated well, no immediate complications    ____________________________________________   INITIAL IMPRESSION / ASSESSMENT AND PLAN / ED COURSE  Samuel Davis is a 48 y.o. who presents to the emergency department for right hand injury. The importance of follow up with the hand specialist was explained to the patient who became tearful during the discussion. He verbalizes concern on how he will be able to function with his dominant arm in a cast as he lives alone. Reassurance was provided. He will be given a work note for a few days in hopes that he can see Dr. Peggye Ley on Tuesday. If he has any concerns between now and his appointment, he was advised to return to the ER or see primary care.  Medications - No data to display  Pertinent labs & imaging results that were available during my care of the patient were reviewed by me and considered in my medical decision making (see chart for details).  _________________________________________   FINAL CLINICAL IMPRESSION(S) / ED DIAGNOSES  Final diagnoses:  Closed displaced Rolando's fracture of right thumb, initial encounter  Closed displaced fracture of trapezium of right wrist, initial encounter  Injury of intermetacarpal ligament of right hand, initial encounter    ED Discharge Orders         Ordered     meloxicam (MOBIC) 15 MG tablet  Daily     05/04/18 1041    traMADol (ULTRAM) 50 MG tablet  Every 6 hours PRN     05/04/18 1041           If controlled substance prescribed during this visit, 12 month history viewed on the [place] prior to issuing an initial prescription for Schedule II or III opiod.    Victorino Dike, FNP 05/04/18 1117    Nena Polio, MD 05/04/18 1531

## 2018-05-04 NOTE — ED Triage Notes (Signed)
Pt to ED via POV c/o right hand pain x 1 day. Pt states that he smashed his hand yesterday. Pt is in NAD at this time.

## 2018-05-04 NOTE — ED Notes (Signed)
First Nurse Note: Pt to ED c/o right hand pain x 1 day. Pt in NAD

## 2018-05-04 NOTE — Discharge Instructions (Signed)
Please call Monday morning to schedule an appointment with the hand specialist. Return to the ER for symptoms of concern if unable to see your primary care provider or the specialist.

## 2018-05-08 HISTORY — PX: THUMB ARTHROSCOPY: SHX2509

## 2018-06-27 ENCOUNTER — Encounter: Payer: Self-pay | Admitting: Family Medicine

## 2018-06-27 ENCOUNTER — Other Ambulatory Visit: Payer: Self-pay

## 2018-06-27 ENCOUNTER — Ambulatory Visit (INDEPENDENT_AMBULATORY_CARE_PROVIDER_SITE_OTHER): Payer: BLUE CROSS/BLUE SHIELD | Admitting: Family Medicine

## 2018-06-27 VITALS — BP 133/86 | HR 66 | Temp 99.0°F | Resp 16 | Ht 74.0 in | Wt 220.6 lb

## 2018-06-27 DIAGNOSIS — S62221A Displaced Rolando's fracture, right hand, initial encounter for closed fracture: Secondary | ICD-10-CM

## 2018-06-27 NOTE — Progress Notes (Signed)
Subjective:    Patient ID: Samuel Davis, male    DOB: September 17, 1970, 48 y.o.   MRN: [id number]  Samuel Davis is a 48 y.o. male presenting on 06/27/2018 for Hand Pain (Right hand, stiffness, he had broke his thumb in 05/02/18 don't know if numbness is due to thumb injury onset 3 weeks)  Patient presents for a same day appointment.  HPI  ED FOLLOW-UP VISIT  Hospital/Location: [facility] Date of ED Visit: 05/04/18  Reason for Presenting to ED: Fall, R Thumb Fracture  FOLLOW-UP  - ED provider note and record have been reviewed - Patient presents today about 7 weeks after recent ED visit. Brief summary of recent course, patient had symptoms of pain in R hand after fall with swelling, presented to ED on 1/25, testing in ED with X-ray showed fracture R Thumb, treated with meloxicam, tramadol, splint and referral to Hand Ortho ([facility] Dr Samuel Davis).  He has seen Dr Samuel Davis for this problem already and given thumb splint and treatment monitoring, repeat x-rays and follow-up. No new concerns at that time from Ortho, they advised next apt next week with repeat x-ray and would get new splint prior to returning to work.  - Today reports overall has done fairly well after discharge. He still complains of numbness, swelling, and stiffness and some radiating pain up forearm to elbow.  He is asking if this is due to the fracture or something else. He asks if this is due to circulation and why it is still stiff. He is doing home therapy exercises as well, he did PT. Not wearing splint all the time. He still uses R hand.  Denies new injury, redness, fever chills, other joint injury, persistent numbness   Depression screen [facility] 2/9 06/27/2018 02/06/2018 12/07/2017  Decreased Interest 0 0 0  Down, Depressed, Hopeless 0 0 0  PHQ - 2 Score 0 0 0    Social History   Tobacco Use  . Smoking status: Former Smoker    Packs/day: 0.75    Years: 10.00    Pack years: 7.50    Types: Cigarettes    Last attempt to quit:  09/08/2017    Years since quitting: 0.8  . Smokeless tobacco: Former Systems developer  . Tobacco comment: Quit cold [place]  Substance Use Topics  . Alcohol use: Not Currently    Alcohol/week: 5.0 standard drinks    Types: 5 Cans of beer per week  . Drug use: Never    Review of Systems Per HPI unless specifically indicated above     Objective:    BP 133/86   Pulse 66   Temp 99 F (37.2 C) (Oral)   Resp 16   Ht 6\' 2"  (1.88 m)   Wt 220 lb 9.6 oz (100.1 kg)   BMI 28.32 kg/m   Wt Readings from Last 3 Encounters:  06/27/18 220 lb 9.6 oz (100.1 kg)  05/04/18 211 lb (95.7 kg)  02/06/18 210 lb (95.3 kg)    Physical Exam Vitals signs and nursing note reviewed.  Constitutional:      General: He is not in acute distress.    Appearance: He is well-developed. He is not diaphoretic.     Comments: Well-appearing, comfortable, cooperative  HENT:     Head: Normocephalic and atraumatic.  Eyes:     General:        Right eye: No discharge.        Left eye: No discharge.     Conjunctiva/sclera: Conjunctivae normal.  Cardiovascular:  Rate and Rhythm: Normal rate.  Pulmonary:     Effort: Pulmonary effort is normal.  Musculoskeletal:     Comments: Right Hand appears to have very minimal trace only soft tissue edema around thumb, no erythema, mild tender to palpation, no deformity  ROM is reduced but partially intact and improving, thumb opposition to other fingers is slow but intact, limited by stiffness. Rest of thumb and hand wrist ROM is intact.  Skin:    General: Skin is warm and dry.     Findings: No erythema or rash.  Neurological:     Mental Status: He is alert and oriented to person, place, and time.  Psychiatric:        Behavior: Behavior normal.     Comments: Well groomed, good eye contact, normal speech and thoughts      I have personally reviewed the radiology report from 05/04/18 Right Hand X-ray.  CLINICAL DATA:  RIGHT hand injury yesterday, pain and swelling.  EXAM:  RIGHT HAND - COMPLETE 3+ VIEW  COMPARISON:  None.  FINDINGS: Displaced/comminuted fracture at the base of the RIGHT first metacarpal bone, likely with some degree of associated impaction at the fracture site.  Additional slight irregularity at the distal margin of the trapezium, suspicious for additional mildly displaced avulsion fracture.  Remainder of the osseous structures of the RIGHT hand appear intact and normally aligned.  IMPRESSION: 1. Displaced/comminuted fracture at the base of the RIGHT first metacarpal bone, likely with some degree of impaction. No convincing fracture line extension to the articular surface at the base of the first metacarpal bone. 2. Mild radial subluxation of the first metacarpal bone relative to the underlying trapezium, suspicious for associated ligamentous injury. 3. Slight irregularity at the distal margin of the underlying trapezium, suspicious for additional mildly displaced avulsion fracture.   Electronically Signed   By: Samuel Davis M.D.   On: 05/04/2018 09:37    Assessment & Plan:   Problem List Items Addressed This Visit    None    Visit Diagnoses    Closed displaced Rolando's fracture of right thumb, initial encounter    -  Primary      Clinically improved from R thumb 1st metacarpal base displaced comminuted fracture from fall on 05/04/18 about 7 as week ago, already managed by [facility] specialist Dr Samuel Davis, has had serial x-rays, splinting and therapy, PT - Now improved range of motion and pain and swelling - Still has some residual mild swelling, and some radiating pain up forearm and some possible nerve symptoms, but seems to have intact light touch and circulation  Plan - Advised him to return to Dr Samuel [facility] as scheduled now, I would not make major changes to his hand specialist ortho treatment plan, he should continue to follow her advice and ask his question to her to eval any possible healing  complications of his fracture - I advised may still take another 3-4 weeks for healing, especially if he is still active and using thumb - His stiffness should keep improving with therapy - Wear splint as advised - Follow-up if significant worsening swelling, worse pain, numbness loss of sensation instead - seems gradually improved   No orders of the defined types were placed in this encounter.    Follow up plan: Return in about 2 weeks (around 07/11/2018), or if symptoms worsen or fail to improve, for hand fracture pain.   Nobie Putnam, [facility] 06/27/2018, 9:56  AM

## 2018-06-27 NOTE — Patient Instructions (Signed)
Thank you for coming to the office today.  Follow up with Dr Peggye Ley for [facility] hand specialist as you are planning to next week. I strongly recommend wrist splint support still if pain and symptoms. Avoid activity that provokes symptoms.  Please schedule a Follow-up Appointment to: No follow-ups on file.  If you have any other questions or concerns, please feel free to call the office or send a message through [facility]. You may also schedule an earlier appointment if necessary.  Additionally, you may be receiving a survey about your experience at our office within a few days to 1 week by e-mail or mail. We value your feedback.  Nobie Putnam, DO [place]

## 2018-10-09 ENCOUNTER — Other Ambulatory Visit: Payer: Self-pay

## 2018-10-09 ENCOUNTER — Encounter: Payer: Self-pay | Admitting: Emergency Medicine

## 2018-10-09 ENCOUNTER — Ambulatory Visit: Payer: BLUE CROSS/BLUE SHIELD | Admitting: Family Medicine

## 2018-10-09 ENCOUNTER — Ambulatory Visit
Admission: EM | Admit: 2018-10-09 | Discharge: 2018-10-09 | Discharge status: Against Medical Advice | Payer: BC Managed Care – PPO | Attending: Emergency Medicine | Admitting: Emergency Medicine

## 2018-10-09 ENCOUNTER — Telehealth: Payer: Self-pay | Admitting: Family Medicine

## 2018-10-09 DIAGNOSIS — R079 Chest pain, unspecified: Secondary | ICD-10-CM | POA: Diagnosis present

## 2018-10-09 DIAGNOSIS — R0602 Shortness of breath: Secondary | ICD-10-CM

## 2018-10-09 NOTE — Telephone Encounter (Signed)
Patient was seen at [facility] today. They did testing and advised that he seek care at [facility] ED, but he declined and left against medical advice.  Nobie Putnam, [facility] 10/09/2018, 12:12 PM

## 2018-10-09 NOTE — Telephone Encounter (Signed)
As per patient had chest discomfort, little light headache and SOB advised him to go to ER or [facility] for quickest evaluation but as per patient it is not that worst and would like to schedule an appointment which was scheduled but later on cancelled by patient. He states, " he has another appointment at 3:20 pm"

## 2018-10-09 NOTE — ED Triage Notes (Signed)
Patient c/o chest pain and SOB that started last night. Patient denies N/V.

## 2018-10-09 NOTE — Discharge Instructions (Addendum)
Call 911 and go immediately to the nearest emergency department if your symptoms return, it is also okay to go to the ER if you get worried but are not having any symptoms.  I am concerned that this could be your heart.  There are a lot of serious causes of your symptoms, and I think you need further evaluation to make sure that this is not something serious

## 2018-10-09 NOTE — ED Provider Notes (Signed)
HPI  SUBJECTIVE:  Samuel Davis is a 48 y.o. male who presents with substernal central chest pain accompanied with shortness of breath starting last night.  It is intermittent, lasting up minutes and then resolving.  Questionable diaphoresis, wheezing.  No nausea, fevers, body aches, loss of sense of smell or taste.  No diarrhea, abdominal pain.  No coughing.  No water brash.  No burning chest pain, belching.  No calf pain or swelling, surgery in the past 4 weeks, recent prolonged immobilization, hemoptysis, trauma.  The pain does not radiate up his neck, down his arm or through to his back.  No known exposure to COVID.  He has never had symptoms like this before.  No aggravating or alleviating factors.  He has not tried anything for this.  It is not associated with exertion, lying down, bending forward.  Past medical history of prediabetes, former smoker.  No history of GERD, MI, DVT, PE, cancer, hypercholesterolemia, hypertension, asthma, emphysema, COPD.  Family history significant for uncle with a fatal MI in his 32s.  No history of DVT or PE.  PMD: [facility].  History reviewed. No pertinent past medical history.  History reviewed. No pertinent surgical history.  Family History  Problem Relation Age of Onset  . Colon cancer Father 84       possibly earlier, but not diagnosed  . Hypertension Sister     Social History   Tobacco Use  . Smoking status: Former Smoker    Packs/day: 0.75    Years: 10.00    Pack years: 7.50    Types: Cigarettes    Quit date: 09/08/2017    Years since quitting: 1.0  . Smokeless tobacco: Former Systems developer  . Tobacco comment: Quit cold [place]  Substance Use Topics  . Alcohol use: Not Currently    Alcohol/week: 5.0 standard drinks    Types: 5 Cans of beer per week  . Drug use: Never    No current facility-administered medications for this encounter.  No current outpatient medications on file.  No Known Allergies   ROS  As noted in HPI.    Physical Exam  BP (!) 150/81 (BP Location: Right Arm)   Pulse 75   Temp 98.7 F (37.1 C) (Oral)   Resp 16   Ht 6\' 2"  (1.88 m)   Wt 99.8 kg   SpO2 100%   BMI 28.25 kg/m   Constitutional: Well developed, well nourished, no acute distress Eyes: PERRL, EOMI, conjunctiva normal bilaterally HENT: Normocephalic, atraumatic,mucus membranes moist Respiratory: Clear to auscultation bilaterally, no rales, no wheezing, no rhonchi.  No chest wall tenderness. Cardiovascular: Normal rate and rhythm, no murmurs, no gallops, no rubs GI: Soft, nondistended, normal bowel sounds, nontender, no rebound, no guarding no pulsatile masses. skin: No rash, skin intact Musculoskeletal: calves symmetric, nontender no edema, no tenderness Neurologic: Alert & oriented x 3, CN II-XII grossly intact, no motor deficits, sensation grossly intact Psychiatric: Speech and behavior appropriate   ED Course   Medications - No data to display  Orders Placed This Encounter  Procedures  . ED EKG    Standing Status:   Standing    Number of Occurrences:   1    Order Specific Question:   Reason for Exam    Answer:   Chest Pain   No results found for this or any previous visit (from the past 24 hour(s)). No results found.  ED Clinical Impression  1. Chest pain, unspecified type   2. Shortness  of breath      ED Assessment/Plan  EKG: Normal sinus rhythm, rate 71.  Normal axis, normal intervals.  No hypertrophy.  No ST elevation.  No previous EKG for comparison.  pt symptomatic while EKG obtained.  While patient's EKG is unremarkable for active ischemia, patient has several cardiac risk factors including being a former smoker, prediabetes, family history.  Had a extensive discussion with the patient that I think that he needs to go to the emergency department for further evaluation to make sure that this is not a cardiopulmonary cause.  Discussed that there are serious, life-threatening causes of his symptoms  and recommended that he go.  Offered to call EMS.  Patient refuses to go despite multiple attempts.  He will sign out AMA.  Encouraged him to go to the ER if his symptoms return or if he gets concerned.  He agrees to do this.    No orders of the defined types were placed in this encounter.   *This clinic note was created using Dragon dictation software. Therefore, there may be occasional mistakes despite careful proofreading.  ?   Melynda Ripple, MD 10/09/18 1116

## 2018-12-05 ENCOUNTER — Telehealth: Payer: Self-pay | Admitting: Family Medicine

## 2018-12-05 ENCOUNTER — Other Ambulatory Visit: Payer: Self-pay | Admitting: Family Medicine

## 2018-12-05 DIAGNOSIS — Z0000 Encounter for general adult medical examination without abnormal findings: Secondary | ICD-10-CM

## 2018-12-05 NOTE — Telephone Encounter (Signed)
Check lab work and diagnosis.

## 2018-12-06 ENCOUNTER — Other Ambulatory Visit: Payer: Self-pay

## 2018-12-06 ENCOUNTER — Other Ambulatory Visit: Payer: BC Managed Care – PPO

## 2018-12-07 LAB — COMPREHENSIVE METABOLIC PANEL
AG Ratio: 2 (calc) (ref 1.0–2.5)
ALT: 38 U/L (ref 9–46)
AST: 35 U/L (ref 10–40)
Albumin: 4.4 g/dL (ref 3.6–5.1)
Alkaline phosphatase (APISO): 33 U/L — ABNORMAL LOW (ref 36–130)
BUN: 22 mg/dL (ref 7–25)
CO2: 25 mmol/L (ref 20–32)
Calcium: 9.1 mg/dL (ref 8.6–10.3)
Chloride: 105 mmol/L (ref 98–110)
Creat: 0.84 mg/dL (ref 0.60–1.35)
Globulin: 2.2 g/dL (calc) (ref 1.9–3.7)
Glucose, Bld: 102 mg/dL — ABNORMAL HIGH (ref 65–99)
Potassium: 4.3 mmol/L (ref 3.5–5.3)
Sodium: 138 mmol/L (ref 135–146)
Total Bilirubin: 0.6 mg/dL (ref 0.2–1.2)
Total Protein: 6.6 g/dL (ref 6.1–8.1)

## 2018-12-07 LAB — LIPID PANEL
Cholesterol: 245 mg/dL — ABNORMAL HIGH (ref <200)
HDL: 97 mg/dL (ref >=40)
LDL Cholesterol (Calc): 134 mg/dL (calc) — ABNORMAL HIGH
Non-HDL Cholesterol (Calc): 148 mg/dL (calc) — ABNORMAL HIGH (ref <130)
Total CHOL/HDL Ratio: 2.5 (calc) (ref <5.0)
Triglycerides: 52 mg/dL (ref <150)

## 2018-12-07 LAB — CBC WITH DIFFERENTIAL/PLATELET
Absolute Monocytes: 350 cells/uL (ref 200–950)
Basophils Absolute: 40 cells/uL (ref 0–200)
Basophils Relative: 1.2 %
Eosinophils Absolute: 182 cells/uL (ref 15–500)
Eosinophils Relative: 5.5 %
HCT: 38.9 % (ref 38.5–50.0)
Hemoglobin: 11.8 g/dL — ABNORMAL LOW (ref 13.2–17.1)
Lymphs Abs: 1505 cells/uL (ref 850–3900)
MCH: 23.2 pg — ABNORMAL LOW (ref 27.0–33.0)
MCHC: 30.3 g/dL — ABNORMAL LOW (ref 32.0–36.0)
MCV: 76.4 fL — ABNORMAL LOW (ref 80.0–100.0)
MPV: 10.9 fL (ref 7.5–12.5)
Monocytes Relative: 10.6 %
Neutro Abs: 1224 cells/uL — ABNORMAL LOW (ref 1500–7800)
Neutrophils Relative %: 37.1 %
Platelets: 144 10*3/uL (ref 140–400)
RBC: 5.09 10*6/uL (ref 4.20–5.80)
RDW: 15.4 % — ABNORMAL HIGH (ref 11.0–15.0)
Total Lymphocyte: 45.6 %
WBC: 3.3 10*3/uL — ABNORMAL LOW (ref 3.8–10.8)

## 2018-12-07 LAB — HEMOGLOBIN A1C
Hgb A1c MFr Bld: 5.6 % of total Hgb (ref <5.7)
Mean Plasma Glucose: 114 (calc)
eAG (mmol/L): 6.3 (calc)

## 2018-12-13 ENCOUNTER — Encounter: Payer: Self-pay | Admitting: Family Medicine

## 2018-12-13 ENCOUNTER — Other Ambulatory Visit: Payer: Self-pay

## 2018-12-13 ENCOUNTER — Other Ambulatory Visit: Payer: Self-pay | Admitting: Family Medicine

## 2018-12-13 ENCOUNTER — Ambulatory Visit (INDEPENDENT_AMBULATORY_CARE_PROVIDER_SITE_OTHER): Payer: BC Managed Care – PPO | Admitting: Family Medicine

## 2018-12-13 ENCOUNTER — Encounter: Payer: BLUE CROSS/BLUE SHIELD | Admitting: Family Medicine

## 2018-12-13 VITALS — BP 138/84 | HR 61 | Temp 98.6°F | Resp 16 | Ht 74.0 in | Wt 209.0 lb

## 2018-12-13 DIAGNOSIS — Z0000 Encounter for general adult medical examination without abnormal findings: Secondary | ICD-10-CM | POA: Diagnosis not present

## 2018-12-13 DIAGNOSIS — R7309 Other abnormal glucose: Secondary | ICD-10-CM

## 2018-12-13 DIAGNOSIS — D509 Iron deficiency anemia, unspecified: Secondary | ICD-10-CM | POA: Insufficient documentation

## 2018-12-13 DIAGNOSIS — Z23 Encounter for immunization: Secondary | ICD-10-CM

## 2018-12-13 DIAGNOSIS — Z1211 Encounter for screening for malignant neoplasm of colon: Secondary | ICD-10-CM | POA: Diagnosis not present

## 2018-12-13 DIAGNOSIS — B070 Plantar wart: Secondary | ICD-10-CM | POA: Diagnosis not present

## 2018-12-13 DIAGNOSIS — Z125 Encounter for screening for malignant neoplasm of prostate: Secondary | ICD-10-CM

## 2018-12-13 DIAGNOSIS — E7800 Pure hypercholesterolemia, unspecified: Secondary | ICD-10-CM

## 2018-12-13 NOTE — Assessment & Plan Note (Signed)
Improved LDL on last lab, with lifestyle improvement Not on medicine

## 2018-12-13 NOTE — Assessment & Plan Note (Signed)
Stable chronic problem without clear diagnosis Results from >20 years ago similar Unsure if family member has similar issue / genetic condition Possibly a thalassemia or other genetic issue  Offered referral to hematology for consultation and further testing, we agree to defer for now, he has no problems or red flag symptoms.

## 2018-12-13 NOTE — Progress Notes (Addendum)
Subjective:    Patient ID: Samuel Davis, male    DOB: 07-17-1970, 48 y.o.   MRN: [id number]  Samuel Davis is a 48 y.o. male presenting on 12/13/2018 for Annual Exam   HPI   Here for Annual Physical and Lab Review  Lifestyle Diet: Tries to eat balanced, fruit / vegetable daily - he does admit eating more pineapple and drinking pineapple juice, drinks only water and some juice, not drinking soda Exercise: regular exercise, less compared to previous He is sleeping well, no concerns.  HYPERLIPIDEMIA: - Reports no concerns. Last lipid panel 11/2018, had improved LDL cholesterol - Not on any cholesterol medication  Chronic Anemia, microcytic / Low WBC Prior results compared with stable slightly declined Hgb and WBC, previous history dating back to >20 yr ago with similar results. He is unaware of other family member with similar issues.   Additional concern  L foot plantar wart - left foot, bottom, walking at work causing pain sometime sharp pain, has had it trimmed down but it still is persistent.   Health Maintenance:  Prostate CA Screening: No prior prostate CA screening.Currently asymptomatic.No known family history of prostate CA.Defer screening at this time. He is interested to add test next year for PSA  Colon CA Screening: Never had colonoscopy. History of hematochezia resolved No recurrent hemorrhoids now, see above.known family history of colon CA, father age 40s. He is now interested in referral for colonoscopy.  UTD Flu vaccine on 12/11/18 at work  Due for TDap, will receive today  Depression screen [facility] 2/9 12/13/2018 12/13/2018 06/27/2018  Decreased Interest 0 0 0  Down, Depressed, Hopeless 0 0 0  PHQ - 2 Score 0 0 0    History reviewed. No pertinent past medical history. History reviewed. No pertinent surgical history. Social History   Socioeconomic History  . Marital status: Single    Spouse name: Not on file  . Number of children: Not on file  . Years  of education: [place], [place]  . Highest education level: Associate degree: occupational, Hotel manager, or vocational program  Occupational History  . Occupation: Distribution  Social Needs  . Financial resource strain: Not on file  . Food insecurity    Worry: Not on file    Inability: Not on file  . Transportation needs    Medical: Not on file    Non-medical: Not on file  Tobacco Use  . Smoking status: Former Smoker    Packs/day: 0.75    Years: 10.00    Pack years: 7.50    Types: Cigarettes    Quit date: 09/08/2017    Years since quitting: 1.2  . Smokeless tobacco: Former Systems developer  . Tobacco comment: Quit cold [place]  Substance and Sexual Activity  . Alcohol use: Yes    Alcohol/week: 5.0 standard drinks    Types: 5 Cans of beer per week  . Drug use: Never  . Sexual activity: Not on file  Lifestyle  . Physical activity    Days per week: Not on file    Minutes per session: Not on file  . Stress: Not on file  Relationships  . Social Herbalist on phone: Not on file    Gets together: Not on file    Attends religious service: Not on file    Active member of club or organization: Not on file    Attends meetings of clubs or organizations: Not on file    Relationship status: Not on file  . Intimate  partner violence    Fear of current or ex partner: Not on file    Emotionally abused: Not on file    Physically abused: Not on file    Forced sexual activity: Not on file  Other Topics Concern  . Not on file  Social History Narrative  . Not on file   Family History  Problem Relation Age of Onset  . Colon cancer Father 46       possibly earlier, but not diagnosed  . Hypertension Sister    No current outpatient medications on file prior to visit.   No current facility-administered medications on file prior to visit.     Review of Systems  Constitutional: Negative for activity change, appetite change, chills, diaphoresis, fatigue and fever.  HENT: Negative for congestion  and hearing loss.   Eyes: Negative for visual disturbance.  Respiratory: Negative for apnea, cough, chest tightness, shortness of breath and wheezing.   Cardiovascular: Negative for chest pain, palpitations and leg swelling.  Gastrointestinal: Negative for abdominal pain, anal bleeding, blood in stool, constipation, diarrhea, nausea and vomiting.  Endocrine: Negative for cold intolerance.  Genitourinary: Negative for difficulty urinating, dysuria, frequency and hematuria.  Musculoskeletal: Negative for arthralgias, back pain and neck pain.  Skin: Negative for rash.  Allergic/Immunologic: Negative for environmental allergies.  Neurological: Negative for dizziness, weakness, light-headedness, numbness and headaches.  Hematological: Negative for adenopathy.  Psychiatric/Behavioral: Negative for behavioral problems, dysphoric mood and sleep disturbance. The patient is not nervous/anxious.    Per HPI unless specifically indicated above     Objective:    BP 138/84 (BP Location: Left Arm, Cuff Size: Normal)   Pulse 61   Temp 98.6 F (37 C) (Oral)   Resp 16   Ht 6\' 2"  (1.88 m)   Wt 209 lb (94.8 kg)   BMI 26.83 kg/m   Wt Readings from Last 3 Encounters:  12/13/18 209 lb (94.8 kg)  10/09/18 220 lb (99.8 kg)  06/27/18 220 lb 9.6 oz (100.1 kg)    Physical Exam Vitals signs and nursing note reviewed.  Constitutional:      General: He is not in acute distress.    Appearance: He is well-developed. He is not diaphoretic.     Comments: Well-appearing, comfortable, cooperative  HENT:     Head: Normocephalic and atraumatic.  Eyes:     General:        Right eye: No discharge.        Left eye: No discharge.     Conjunctiva/sclera: Conjunctivae normal.     Pupils: Pupils are equal, round, and reactive to light.  Neck:     Musculoskeletal: Normal range of motion and neck supple.     Thyroid: No thyromegaly.  Cardiovascular:     Rate and Rhythm: Normal rate and regular rhythm.     Heart  sounds: Normal heart sounds. No murmur.  Pulmonary:     Effort: Pulmonary effort is normal. No respiratory distress.     Breath sounds: Normal breath sounds. No wheezing or rales.  Abdominal:     General: Bowel sounds are normal. There is no distension.     Palpations: Abdomen is soft. There is no mass.     Tenderness: There is no abdominal tenderness.  Musculoskeletal: Normal range of motion.        General: No tenderness.     Comments: Upper / Lower Extremities: - Normal muscle tone, strength bilateral upper extremities 5/5, lower extremities 5/5  Lymphadenopathy:  Cervical: No cervical adenopathy.  Skin:    General: Skin is warm and dry.     Findings: No erythema or rash.  Neurological:     Mental Status: He is alert and oriented to person, place, and time.     Comments: Distal sensation intact to light touch all extremities  Psychiatric:        Behavior: Behavior normal.     Comments: Well groomed, good eye contact, normal speech and thoughts      Results for orders placed or performed in visit on 12/05/18  CBC with Differential  Result Value Ref Range   WBC 3.3 (L) 3.8 - 10.8 Thousand/uL   RBC 5.09 4.20 - 5.80 Million/uL   Hemoglobin 11.8 (L) 13.2 - 17.1 g/dL   HCT 38.9 38.5 - 50.0 %   MCV 76.4 (L) 80.0 - 100.0 fL   MCH 23.2 (L) 27.0 - 33.0 pg   MCHC 30.3 (L) 32.0 - 36.0 g/dL   RDW 15.4 (H) 11.0 - 15.0 %   Platelets 144 140 - 400 Thousand/uL   MPV 10.9 7.5 - 12.5 fL   Neutro Abs 1,224 (L) 1,500 - 7,800 cells/uL   Lymphs Abs 1,505 850 - 3,900 cells/uL   Absolute Monocytes 350 200 - 950 cells/uL   Eosinophils Absolute 182 15 - 500 cells/uL   Basophils Absolute 40 0 - 200 cells/uL   Neutrophils Relative % 37.1 %   Total Lymphocyte 45.6 %   Monocytes Relative 10.6 %   Eosinophils Relative 5.5 %   Basophils Relative 1.2 %  Comprehensive Metabolic Panel (CMET)  Result Value Ref Range   Glucose, Bld 102 (H) 65 - 99 mg/dL   BUN 22 7 - 25 mg/dL   Creat 0.84 0.60 -  1.35 mg/dL   BUN/Creatinine Ratio NOT APPLICABLE 6 - 22 (calc)   Sodium 138 135 - 146 mmol/L   Potassium 4.3 3.5 - 5.3 mmol/L   Chloride 105 98 - 110 mmol/L   CO2 25 20 - 32 mmol/L   Calcium 9.1 8.6 - 10.3 mg/dL   Total Protein 6.6 6.1 - 8.1 g/dL   Albumin 4.4 3.6 - 5.1 g/dL   Globulin 2.2 1.9 - 3.7 g/dL (calc)   AG Ratio 2.0 1.0 - 2.5 (calc)   Total Bilirubin 0.6 0.2 - 1.2 mg/dL   Alkaline phosphatase (APISO) 33 (L) 36 - 130 U/L   AST 35 10 - 40 U/L   ALT 38 9 - 46 U/L  Lipid Profile  Result Value Ref Range   Cholesterol 245 (H) <200 mg/dL   HDL 97 > OR = 40 mg/dL   Triglycerides 52 <150 mg/dL   LDL Cholesterol (Calc) 134 (H) mg/dL (calc)   Total CHOL/HDL Ratio 2.5 <5.0 (calc)   Non-HDL Cholesterol (Calc) 148 (H) <130 mg/dL (calc)  Hemoglobin A1c  Result Value Ref Range   Hgb A1c MFr Bld 5.6 <5.7 % of total Hgb   Mean Plasma Glucose 114 (calc)   eAG (mmol/L) 6.3 (calc)      Assessment & Plan:   Problem List Items Addressed This Visit    Elevated LDL cholesterol level    Improved LDL on last lab, with lifestyle improvement Not on medicine      Microcytic anemia    Stable chronic problem without clear diagnosis Results from >20 years ago similar Unsure if family member has similar issue / genetic condition Possibly a thalassemia or other genetic issue  Offered referral to hematology for consultation and  further testing, we agree to defer for now, he has no problems or red flag symptoms.       Other Visit Diagnoses    Annual physical exam    -  Primary   Need for diphtheria-tetanus-pertussis (Tdap) vaccine       Relevant Orders   Tdap vaccine greater than or equal to 7yo IM (Completed)   Screening for colon cancer       Relevant Orders   Ambulatory referral to Gastroenterology   Plantar wart of left foot       Relevant Orders   Ambulatory referral to Podiatry     Updated Health Maintenance information - Tdap today - already updated flu shot - referral to  GI for colonoscopy routine screening age >43+ and with fam history - next year PSA Reviewed recent lab results with patient Encouraged improvement to lifestyle with diet and exercise   #Elevated BP initial, repeat improve Monitor BP - follow up sooner if elevated or other concern.  Orders Placed This Encounter  Procedures  . Tdap vaccine greater than or equal to 7yo IM  . Ambulatory referral to Gastroenterology    Referral Priority:   Routine    Referral Type:   Consultation    Referral Reason:   Specialty Services Required    Number of Visits Requested:   1  . Ambulatory referral to Podiatry    Referral Priority:   Routine    Referral Type:   Consultation    Referral Reason:   Specialty Services Required    Requested Specialty:   Podiatry    Number of Visits Requested:   1    No orders of the defined types were placed in this encounter.    Follow up plan: Return in about 1 year (around 12/13/2019) for Annual Physical.  Future labs ordered for 12/2019  Nobie Putnam, [facility] 12/13/2018, 9:13 AM

## 2018-12-13 NOTE — Patient Instructions (Addendum)
Thank you for coming to the office today.  1. Chemistry - Mostly Normal results, including electrolytes, kidney and liver function. Fasting sugar borderline around 100.   2. Hemoglobin A1c (Diabetes screening) - 5.6, improved from 1 yr ago at 5.7, normal not in range of Pre-Diabetes (>5.7 to 6.4)   3. Cholesterol - Improved - LDL cholesterol from 160 down to 134   4. CBC Blood Counts - Persistent mild low WBC, slightly low Hemoglobin, still with some anemia. Consider evaluation by Hematology.  Today will be Tdap tetanus, good for 10 years  --------------------  Stay tuned for apt from [place] Address: [address] [place], [place] [address] Hours: Open 8AM-5PM Phone: [phone]  Ordered referral for colonoscopy  [facility]) [place], [place] [address] Phone: [phone]  DUE for [place] (no food or drink after midnight before the lab appointment, only water or coffee without cream/sugar on the morning of)  SCHEDULE "Lab Only" visit in the morning at the clinic for lab draw in 1 YEAR  - Make sure Lab Only appointment is at about 1 week before your next appointment, so that results will be available  For Lab Results, once available within 2-3 days of blood draw, you can can log in to MyChart online to view your results and a brief explanation. Also, we can discuss results at next follow-up visit.   Please schedule a Follow-up Appointment to: Return in about 1 year (around 12/13/2019) for Annual Physical.  If you have any other questions or concerns, please feel free to call the office or send a message through [place]. You may also schedule an earlier appointment if necessary.  Additionally, you may be receiving a survey about your experience at our office within a few days to 1 week by e-mail or mail. We value your feedback.  Nobie Putnam, DO [place]

## 2018-12-25 ENCOUNTER — Encounter: Payer: Self-pay | Admitting: *Deleted

## 2019-01-21 ENCOUNTER — Other Ambulatory Visit: Payer: Self-pay

## 2019-01-21 DIAGNOSIS — Z20822 Contact with and (suspected) exposure to covid-19: Secondary | ICD-10-CM

## 2019-01-23 LAB — NOVEL CORONAVIRUS, NAA: SARS-CoV-2, NAA: NOT DETECTED

## 2019-02-27 ENCOUNTER — Ambulatory Visit: Payer: BC Managed Care – PPO | Admitting: Gastroenterology

## 2019-02-28 ENCOUNTER — Ambulatory Visit: Payer: BC Managed Care – PPO | Admitting: Gastroenterology

## 2019-04-17 ENCOUNTER — Encounter: Payer: Self-pay | Admitting: Gastroenterology

## 2019-04-17 ENCOUNTER — Ambulatory Visit: Payer: BC Managed Care – PPO | Admitting: Gastroenterology

## 2019-04-17 ENCOUNTER — Other Ambulatory Visit: Payer: Self-pay

## 2019-04-17 VITALS — BP 152/91 | HR 60 | Temp 98.2°F | Ht 74.0 in | Wt 211.4 lb

## 2019-04-17 DIAGNOSIS — Z800 Family history of malignant neoplasm of digestive organs: Secondary | ICD-10-CM

## 2019-04-17 DIAGNOSIS — K641 Second degree hemorrhoids: Secondary | ICD-10-CM

## 2019-04-17 DIAGNOSIS — Z1211 Encounter for screening for malignant neoplasm of colon: Secondary | ICD-10-CM

## 2019-04-17 MED ORDER — HYDROCORTISONE ACETATE 25 MG RE SUPP: 25.0000 mg | Freq: Two times a day (BID) | RECTAL | 0 refills | Status: AC

## 2019-04-17 NOTE — Patient Instructions (Signed)
Hemorrhoids Hemorrhoids are swollen veins that may develop:  In the butt (rectum). These are called internal hemorrhoids.  Around the opening of the butt (anus). These are called external hemorrhoids. Hemorrhoids can cause pain, itching, or bleeding. Most of the time, they do not cause serious problems. They usually get better with diet changes, lifestyle changes, and other home treatments. What are the causes? This condition may be caused by:  Having trouble pooping (constipation).  Pushing hard (straining) to poop.  Watery poop (diarrhea).  Pregnancy.  Being very overweight (obese).  Sitting for long periods of time.  Heavy lifting or other activity that causes you to strain.  Anal sex.  Riding a bike for a long period of time. What are the signs or symptoms? Symptoms of this condition include:  Pain.  Itching or soreness in the butt.  Bleeding from the butt.  Leaking poop.  Swelling in the area.  One or more lumps around the opening of your butt. How is this diagnosed? A doctor can often diagnose this condition by looking at the affected area. The doctor may also:  Do an exam that involves feeling the area with a gloved hand (digital rectal exam).  Examine the area inside your butt using a small tube (anoscope).  Order blood tests. This may be done if you have lost a lot of blood.  Have you get a test that involves looking inside the colon using a flexible tube with a camera on the end (sigmoidoscopy or colonoscopy). How is this treated? This condition can usually be treated at home. Your doctor may tell you to change what you eat, make lifestyle changes, or try home treatments. If these do not help, procedures can be done to remove the hemorrhoids or make them smaller. These may involve:  Placing rubber bands at the base of the hemorrhoids to cut off their blood supply.  Injecting medicine into the hemorrhoids to shrink them.  Shining a type of light  energy onto the hemorrhoids to cause them to fall off.  Doing surgery to remove the hemorrhoids or cut off their blood supply. Follow these instructions at home: Eating and drinking   Eat foods that have a lot of fiber in them. These include whole grains, beans, nuts, fruits, and vegetables.  Ask your doctor about taking products that have added fiber (fibersupplements).  Reduce the amount of fat in your diet. You can do this by: ? Eating low-fat dairy products. ? Eating less red meat. ? Avoiding processed foods.  Drink enough fluid to keep your pee (urine) pale yellow. Managing pain and swelling   Take a warm-water bath (sitz bath) for 20 minutes to ease pain. Do this 3-4 times a day. You may do this in a bathtub or using a portable sitz bath that fits over the toilet.  If told, put ice on the painful area. It may be helpful to use ice between your warm baths. ? Put ice in a plastic bag. ? Place a towel between your skin and the bag. ? Leave the ice on for 20 minutes, 2-3 times a day. General instructions  Take over-the-counter and prescription medicines only as told by your doctor. ? Medicated creams and medicines may be used as told.  Exercise often. Ask your doctor how much and what kind of exercise is best for you.  Go to the bathroom when you have the urge to poop. Do not wait.  Avoid pushing too hard when you poop.  Keep your   butt dry and clean. Use wet toilet paper or moist towelettes after pooping.  Do not sit on the toilet for a long time.  Keep all follow-up visits as told by your doctor. This is important. Contact a doctor if you:  Have pain and swelling that do not get better with treatment or medicine.  Have trouble pooping.  Cannot poop.  Have pain or swelling outside the area of the hemorrhoids. Get help right away if you have:  Bleeding that will not stop. Summary  Hemorrhoids are swollen veins in the butt or around the opening of the  butt.  They can cause pain, itching, or bleeding.  Eat foods that have a lot of fiber in them. These include whole grains, beans, nuts, fruits, and vegetables.  Take a warm-water bath (sitz bath) for 20 minutes to ease pain. Do this 3-4 times a day. This information is not intended to replace advice given to you by your health care provider. Make sure you discuss any questions you have with your health care provider. Document Revised: 04/04/2018 Document Reviewed: 08/16/2017 Elsevier Patient Education  2020 Elsevier Inc.  

## 2019-04-17 NOTE — Addendum Note (Signed)
Addended by: Dorethea Clan on: 04/17/2019 02:56 PM   Modules accepted: Orders

## 2019-04-17 NOTE — Progress Notes (Signed)
Jonathon Bellows MD, MRCP(U.K) [address]  [place], [place] [address]  Main: [phone]  Fax: [phone]   Gastroenterology Consultation  Referring Provider:     Nobie Putnam * Primary Care Physician:  Olin Hauser, DO Primary Gastroenterologist:  Dr. Jonathon Bellows  Reason for Consultation:     Colon cancer screening        HPI:   Samuel Davis is a 49 y.o. y/o male referred for consultation & management  by Dr. Parks Ranger, Devonne Doughty, DO.    He has been referred for colon cancer screening.He has a stable microcytic anemia which has been so for over 20 years.  He says that his father had colon cancer at the age of 22.  He never had a colonoscopy.  He has occasional perianal itching, mucosal seepage, occasional rectal bleeding after bowel movement.  Not allergic to latex.  Feels like a protrusion from his anus which resolves spontaneously after a bowel movement.  No past medical history on file.  No past surgical history on file.  Prior to Admission medications   Not on File    Family History  Problem Relation Age of Onset  . Colon cancer Father 63       possibly earlier, but not diagnosed  . Hypertension Sister      Social History   Tobacco Use  . Smoking status: Former Smoker    Packs/day: 0.75    Years: 10.00    Pack years: 7.50    Types: Cigarettes    Quit date: 09/08/2017    Years since quitting: 1.6  . Smokeless tobacco: Former Systems developer  . Tobacco comment: Quit cold [place]  Substance Use Topics  . Alcohol use: Yes    Alcohol/week: 5.0 standard drinks    Types: 5 Cans of beer per week  . Drug use: Never    Allergies as of 04/17/2019  . (No Known Allergies)    Review of Systems:    All systems reviewed and negative except where noted in HPI.   Physical Exam:  BP (!) 152/91   Pulse 60   Temp 98.2 F (36.8 C)   Ht 6\' 2"  (1.88 m)   Wt 211 lb 6.4 oz (95.9 kg)   BMI 27.14 kg/m  No LMP for male patient. Psych:   Alert and cooperative. Normal mood and affect. General:   Alert,  Well-developed, well-nourished, pleasant and cooperative in NAD Head:  Normocephalic and atraumatic. Eyes:  Sclera clear, no icterus.   Conjunctiva pink. Ears:  Normal auditory acuity. Lungs:  Respirations even and unlabored.  Clear throughout to auscultation.   No wheezes, crackles, or rhonchi. No acute distress. Heart:  Regular rate and rhythm; no murmurs, clicks, rubs, or gallops. Abdomen:  Normal bowel sounds.  No bruits.  Soft, non-tender and non-distended without masses, hepatosplenomegaly or hernias noted.  No guarding or rebound tenderness.    Neurologic:  Alert and oriented x3;  grossly normal neurologically. Skin:  Intact without significant lesions or rashes. No jaundice. Lymph Nodes:  No significant cervical adenopathy. Psych:  Alert and cooperative. Normal mood and affect.  Imaging Studies: No results found.  Assessment and Plan:   Samuel Davis is a 49 y.o. y/o male has been referred for a screening colonoscopy with a family history of colon cancer in his father.  Never had prior colonoscopy.  He also has a history suggestive of grade 2 internal hemorrhoids.  Discussed options of conservative management of the same which  he chose over hemorrhoidal banding.  He will commence on conservative management and if it does not improve will consider banding of his hemorrhoids.  Plan 1.  Screening colonoscopy 2.  Sitz bath, trial of Anusol suppository for 5 days.  Counseled on perianal hygiene.  I have discussed alternative options, risks & benefits,  which include, but are not limited to, bleeding, infection, perforation,respiratory complication & drug reaction.  The patient agrees with this plan & written consent will be obtained.     Follow up in 6 weeks  Dr Jonathon Bellows MD,MRCP(U.K)

## 2019-04-30 ENCOUNTER — Telehealth: Payer: Self-pay

## 2019-04-30 ENCOUNTER — Other Ambulatory Visit: Payer: Self-pay

## 2019-04-30 ENCOUNTER — Other Ambulatory Visit: Admission: RE | Admit: 2019-04-30 | Payer: BC Managed Care – PPO | Source: Ambulatory Visit

## 2019-04-30 DIAGNOSIS — Z1211 Encounter for screening for malignant neoplasm of colon: Secondary | ICD-10-CM

## 2019-04-30 DIAGNOSIS — Z800 Family history of malignant neoplasm of digestive organs: Secondary | ICD-10-CM

## 2019-04-30 NOTE — Telephone Encounter (Signed)
Patients call was transferred to me from Endo.  Pt requested to reschedule his colonoscopy(Screening colon, family history of colon cancer) with Dr. Vicente Males to another Friday.  Checked Dr. Georgeann Oppenheim availability and he is not available on a Friday until 06/27/19.  Pt does not want to wait that long.   I discussed this with Dr. Vicente Males and he is agreeable  to having patient's colonoscopy with another provider in the office.  I've rescheduled his colonoscopy to 06/13/19 with Dr. Marius Ditch at [facility].  Message routed to Dr. Marius Ditch to make her aware.  Patient-thanks both of you.  Thanks,  Sharyn Lull

## 2019-05-02 ENCOUNTER — Encounter: Admission: RE | Payer: Self-pay | Source: Home / Self Care

## 2019-05-02 ENCOUNTER — Ambulatory Visit
Admission: RE | Admit: 2019-05-02 | Payer: BC Managed Care – PPO | Source: Home / Self Care | Admitting: Gastroenterology

## 2019-05-02 SURGERY — COLONOSCOPY WITH PROPOFOL
Anesthesia: General

## 2019-05-06 ENCOUNTER — Ambulatory Visit: Payer: BC Managed Care – PPO | Admitting: Gastroenterology

## 2019-06-02 ENCOUNTER — Ambulatory Visit: Payer: BC Managed Care – PPO | Admitting: Gastroenterology

## 2019-06-09 ENCOUNTER — Other Ambulatory Visit: Payer: Self-pay

## 2019-06-09 ENCOUNTER — Encounter: Payer: Self-pay | Admitting: Gastroenterology

## 2019-06-10 NOTE — Discharge Instructions (Signed)
General Anesthesia, Adult, Care After This sheet gives you information about how to care for yourself after your procedure. Your health care provider may also give you more specific instructions. If you have problems or questions, contact your health care provider. What can I expect after the procedure? After the procedure, the following side effects are common:  Pain or discomfort at the IV site.  Nausea.  Vomiting.  Sore throat.  Trouble concentrating.  Feeling cold or chills.  Weak or tired.  Sleepiness and fatigue.  Soreness and body aches. These side effects can affect parts of the body that were not involved in surgery. Follow these instructions at home:  For at least 24 hours after the procedure:  Have a responsible adult stay with you. It is important to have someone help care for you until you are awake and alert.  Rest as needed.  Do not: ? Participate in activities in which you could fall or become injured. ? Drive. ? Use heavy machinery. ? Drink alcohol. ? Take sleeping pills or medicines that cause drowsiness. ? Make important decisions or sign legal documents. ? Take care of children on your own. Eating and drinking  Follow any instructions from your health care provider about eating or drinking restrictions.  When you feel hungry, start by eating small amounts of foods that are soft and easy to digest (bland), such as toast. Gradually return to your regular diet.  Drink enough fluid to keep your urine pale yellow.  If you vomit, rehydrate by drinking water, juice, or clear broth. General instructions  If you have sleep apnea, surgery and certain medicines can increase your risk for breathing problems. Follow instructions from your health care provider about wearing your sleep device: ? Anytime you are sleeping, including during daytime naps. ? While taking prescription pain medicines, sleeping medicines, or medicines that make you drowsy.  Return to  your normal activities as told by your health care provider. Ask your health care provider what activities are safe for you.  Take over-the-counter and prescription medicines only as told by your health care provider.  If you smoke, do not smoke without supervision.  Keep all follow-up visits as told by your health care provider. This is important. Contact a health care provider if:  You have nausea or vomiting that does not get better with medicine.  You cannot eat or drink without vomiting.  You have pain that does not get better with medicine.  You are unable to pass urine.  You develop a skin rash.  You have a fever.  You have redness around your IV site that gets worse. Get help right away if:  You have difficulty breathing.  You have chest pain.  You have blood in your urine or stool, or you vomit blood. Summary  After the procedure, it is common to have a sore throat or nausea. It is also common to feel tired.  Have a responsible adult stay with you for the first 24 hours after general anesthesia. It is important to have someone help care for you until you are awake and alert.  When you feel hungry, start by eating small amounts of foods that are soft and easy to digest (bland), such as toast. Gradually return to your regular diet.  Drink enough fluid to keep your urine pale yellow.  Return to your normal activities as told by your health care provider. Ask your health care provider what activities are safe for you. This information is not   intended to replace advice given to you by your health care provider. Make sure you discuss any questions you have with your health care provider. Document Revised: 03/30/2017 Document Reviewed: 11/10/2016 Elsevier Patient Education  2020 Elsevier Inc.  

## 2019-06-11 ENCOUNTER — Other Ambulatory Visit: Payer: Self-pay

## 2019-06-11 ENCOUNTER — Other Ambulatory Visit
Admission: RE | Admit: 2019-06-11 | Discharge: 2019-06-11 | Disposition: A | Discharge status: Home / Self Care | Payer: BC Managed Care – PPO | Source: Ambulatory Visit | Attending: Gastroenterology | Admitting: Gastroenterology

## 2019-06-11 DIAGNOSIS — Z20822 Contact with and (suspected) exposure to covid-19: Secondary | ICD-10-CM | POA: Diagnosis not present

## 2019-06-11 DIAGNOSIS — Z01812 Encounter for preprocedural laboratory examination: Secondary | ICD-10-CM | POA: Diagnosis present

## 2019-06-11 LAB — SARS CORONAVIRUS 2 (TAT 6-24 HRS): SARS Coronavirus 2: NEGATIVE

## 2019-06-12 ENCOUNTER — Telehealth: Payer: Self-pay | Admitting: Gastroenterology

## 2019-06-12 NOTE — Telephone Encounter (Signed)
Patient called in & is returning Michelle's call. He would also like a cheaper prep called into [facility] in [place]. His procedure is tomorrow 06-13-2019

## 2019-06-12 NOTE — Telephone Encounter (Signed)
Patients bowel prep has been changed to [id number].  Pt has been asked to start his bowel prep this evening at 5pm.  Following mixing instructions.  Drink 8 oz every 30 minutes until completed the entire contents.  Thanks,  [place], [place]

## 2019-06-13 ENCOUNTER — Other Ambulatory Visit: Payer: Self-pay

## 2019-06-13 ENCOUNTER — Ambulatory Visit: Payer: BC Managed Care – PPO | Admitting: Anesthesiology

## 2019-06-13 ENCOUNTER — Ambulatory Visit
Admission: RE | Admit: 2019-06-13 | Discharge: 2019-06-13 | Disposition: A | Discharge status: Home / Self Care | Payer: BC Managed Care – PPO | Attending: Gastroenterology | Admitting: Gastroenterology

## 2019-06-13 ENCOUNTER — Encounter
Admission: RE | Disposition: A | Discharge status: Home / Self Care | Payer: Self-pay | Source: Home / Self Care | Attending: Gastroenterology

## 2019-06-13 ENCOUNTER — Encounter: Payer: Self-pay | Admitting: Gastroenterology

## 2019-06-13 DIAGNOSIS — Z800 Family history of malignant neoplasm of digestive organs: Secondary | ICD-10-CM | POA: Diagnosis not present

## 2019-06-13 DIAGNOSIS — K644 Residual hemorrhoidal skin tags: Secondary | ICD-10-CM | POA: Diagnosis not present

## 2019-06-13 DIAGNOSIS — Z87891 Personal history of nicotine dependence: Secondary | ICD-10-CM | POA: Diagnosis not present

## 2019-06-13 DIAGNOSIS — Z1211 Encounter for screening for malignant neoplasm of colon: Secondary | ICD-10-CM | POA: Diagnosis not present

## 2019-06-13 DIAGNOSIS — K5730 Diverticulosis of large intestine without perforation or abscess without bleeding: Secondary | ICD-10-CM | POA: Insufficient documentation

## 2019-06-13 HISTORY — DX: Presence of dental prosthetic device (complete) (partial): Z97.2

## 2019-06-13 HISTORY — PX: COLONOSCOPY WITH PROPOFOL: SHX5780

## 2019-06-13 SURGERY — COLONOSCOPY WITH PROPOFOL
Anesthesia: General | Site: Rectum

## 2019-06-13 MED ORDER — LACTATED RINGERS IV SOLN: INTRAVENOUS | Status: DC

## 2019-06-13 MED ORDER — STERILE WATER FOR IRRIGATION IR SOLN
Status: DC | PRN
  Administered 2019-06-13: .05 mL

## 2019-06-13 MED ORDER — LIDOCAINE HCL (CARDIAC) PF 100 MG/5ML IV SOSY
PREFILLED_SYRINGE | INTRAVENOUS | Status: DC | PRN
  Administered 2019-06-13: 40 mg via INTRAVENOUS

## 2019-06-13 MED ORDER — PROPOFOL 10 MG/ML IV BOLUS
INTRAVENOUS | Status: DC | PRN
  Administered 2019-06-13: 20 mg via INTRAVENOUS
  Administered 2019-06-13: 80 mg via INTRAVENOUS
  Administered 2019-06-13 (×2): 50 mg via INTRAVENOUS

## 2019-06-13 MED ORDER — GLYCOPYRROLATE 0.2 MG/ML IJ SOLN
INTRAMUSCULAR | Status: DC | PRN
  Administered 2019-06-13: .2 mg via INTRAVENOUS

## 2019-06-13 SURGICAL SUPPLY — 5 items
CANISTER SUCT 1200ML W/VALVE (MISCELLANEOUS) ×3 IMPLANT
GOWN CVR UNV OPN BCK APRN NK (MISCELLANEOUS) ×2 IMPLANT
GOWN ISOL THUMB LOOP REG UNIV (MISCELLANEOUS) ×4
KIT ENDO PROCEDURE OLY (KITS) ×3 IMPLANT
WATER STERILE IRR 250ML POUR (IV SOLUTION) ×3 IMPLANT

## 2019-06-13 NOTE — Transfer of Care (Signed)
Immediate Anesthesia Transfer of Care Note  Patient: Samuel Davis  Procedure(s) Performed: COLONOSCOPY WITH PROPOFOL (N/A Rectum)  Patient Location: PACU  Anesthesia Type: General  Level of Consciousness: awake, alert  and patient cooperative  Airway and Oxygen Therapy: Patient Spontanous Breathing and Patient connected to supplemental oxygen  Post-op Assessment: Post-op Vital signs reviewed, Patient's Cardiovascular Status Stable, Respiratory Function Stable, Patent Airway and No signs of Nausea or vomiting  Post-op Vital Signs: Reviewed and stable  Complications: No apparent anesthesia complications

## 2019-06-13 NOTE — Anesthesia Postprocedure Evaluation (Signed)
Anesthesia Post Note  Patient: Samuel Davis  Procedure(s) Performed: COLONOSCOPY WITH PROPOFOL (N/A Rectum)     Patient location during evaluation: PACU Anesthesia Type: General Level of consciousness: awake and alert Pain management: pain level controlled Vital Signs Assessment: post-procedure vital signs reviewed and stable Respiratory status: spontaneous breathing, nonlabored ventilation, respiratory function stable and patient connected to nasal cannula oxygen Cardiovascular status: blood pressure returned to baseline and stable Postop Assessment: no apparent nausea or vomiting Anesthetic complications: no    Adele Barthel Lory Nowaczyk

## 2019-06-13 NOTE — Anesthesia Preprocedure Evaluation (Signed)
Anesthesia Evaluation  Patient identified by MRN, date of birth, ID band Patient awake    History of Anesthesia Complications Negative for: history of anesthetic complications  Airway Mallampati: III  TM Distance: >3 FB   Mouth opening: Limited Mouth Opening  Dental  (+) Partial Upper   Pulmonary neg pulmonary ROS, former smoker,    Pulmonary exam normal        Cardiovascular Exercise Tolerance: Good negative cardio ROS Normal cardiovascular exam     Neuro/Psych negative neurological ROS     GI/Hepatic negative GI ROS, Neg liver ROS,   Endo/Other  negative endocrine ROS  Renal/GU negative Renal ROS     Musculoskeletal negative musculoskeletal ROS (+)   Abdominal   Peds  Hematology negative hematology ROS (+)   Anesthesia Other Findings   Reproductive/Obstetrics                             Anesthesia Physical Anesthesia Plan  ASA: I  Anesthesia Plan: General   Post-op Pain Management:    Induction: Intravenous  PONV Risk Score and Plan: 2 and Propofol infusion, TIVA and Treatment may vary due to age or medical condition  Airway Management Planned: Nasal Cannula  Additional Equipment: None  Intra-op Plan:   Post-operative Plan:   Informed Consent: I have reviewed the patients History and Physical, chart, labs and discussed the procedure including the risks, benefits and alternatives for the proposed anesthesia with the patient or authorized representative who has indicated his/her understanding and acceptance.       Plan Discussed with: CRNA  Anesthesia Plan Comments:         Anesthesia Quick Evaluation

## 2019-06-13 NOTE — H&P (Signed)
Samuel Darby, MD [address]  [place]  [place], [place] [address]  Main: [phone]  Fax: [phone] Pager: [phone]  Primary Care Physician:  Olin Hauser, DO Primary Gastroenterologist:  Dr. Cephas Davis  Pre-Procedure History & Physical: HPI:  Samuel Davis is a 49 y.o. male is here for an colonoscopy.   Past Medical History:  Diagnosis Date  . Wears dentures    partial upper    Past Surgical History:  Procedure Laterality Date  . THUMB ARTHROSCOPY  05/08/2018   Fracture repair.  [facility]    Prior to Admission medications   Medication Sig Start Date End Date Taking? Authorizing Provider  Cyanocobalamin (VITAMIN B12 PO) Take by mouth daily.   Yes [provider]  Multiple Vitamins-Minerals (ZINC PO) Take by mouth daily.   Yes [provider]    Allergies as of 04/30/2019  . (No Known Allergies)    Family History  Problem Relation Age of Onset  . Colon cancer Father 15       possibly earlier, but not diagnosed  . Hypertension Sister     Social History   Socioeconomic History  . Marital status: Single    Spouse name: Not on file  . Number of children: Not on file  . Years of education: [place], [place]  . Highest education level: Associate degree: occupational, Hotel manager, or vocational program  Occupational History  . Occupation: Distribution  Tobacco Use  . Smoking status: Former Smoker    Packs/day: 0.75    Years: 10.00    Pack years: 7.50    Types: Cigarettes    Quit date: 09/08/2017    Years since quitting: 1.7  . Smokeless tobacco: Former Systems developer  . Tobacco comment: Quit cold [place]  Substance and Sexual Activity  . Alcohol use: Yes    Alcohol/week: 7.0 standard drinks    Types: 7 Cans of beer per week  . Drug use: Never  . Sexual activity: Not on file  Other Topics Concern  . Not on file  Social History Narrative  . Not on file   Social Determinants of Health   Financial Resource Strain:   .  Difficulty of Paying Living Expenses: Not on file  Food Insecurity:   . Worried About Charity fundraiser in the Last Year: Not on file  . Ran Out of Food in the Last Year: Not on file  Transportation Needs:   . Lack of Transportation (Medical): Not on file  . Lack of Transportation (Non-Medical): Not on file  Physical Activity:   . Days of Exercise per Week: Not on file  . Minutes of Exercise per Session: Not on file  Stress:   . Feeling of Stress : Not on file  Social Connections:   . Frequency of Communication with Friends and Family: Not on file  . Frequency of Social Gatherings with Friends and Family: Not on file  . Attends Religious Services: Not on file  . Active Member of Clubs or Organizations: Not on file  . Attends Archivist Meetings: Not on file  . Marital Status: Not on file  Intimate Partner Violence:   . Fear of Current or Ex-Partner: Not on file  . Emotionally Abused: Not on file  . Physically Abused: Not on file  . Sexually Abused: Not on file    Review of Systems: See HPI, otherwise negative ROS  Physical Exam: BP (!) 139/96   Pulse (!) 57   Temp 97.8  F (36.6 C) (Temporal)   Ht 6\' 2"  (1.88 m)   Wt 97.1 kg   SpO2 100%   BMI 27.48 kg/m  General:   Alert,  pleasant and cooperative in NAD Head:  Normocephalic and atraumatic. Neck:  Supple; no masses or thyromegaly. Lungs:  Clear throughout to auscultation.    Heart:  Regular rate and rhythm. Abdomen:  Soft, nontender and nondistended. Normal bowel sounds, without guarding, and without rebound.   Neurologic:  Alert and  oriented x4;  grossly normal neurologically.  Impression/Plan: Cartez Tritz is here for an colonoscopy to be performed for colon cancer screening  Risks, benefits, limitations, and alternatives regarding  colonoscopy have been reviewed with the patient.  Questions have been answered.  All parties agreeable.   Sherri Sear, MD  06/13/2019, 10:36 AM

## 2019-06-13 NOTE — Op Note (Signed)
[facility] Gastroenterology Patient Name: Samuel Davis Procedure Date: 06/13/2019 11:11 AM MRN: [id number] Account #: [id number] Date of Birth: [birth year] Admit Type: Outpatient Age: 49 Room: [facility] OR ROOM 01 Gender: Male Note Status: Finalized Procedure:             Colonoscopy Indications:           Screening in patient at increased risk: Colorectal                         cancer in father before age 42, This is the patient's                         first colonoscopy Providers:             Lin Landsman MD, MD Referring MD:          Olin Hauser (Referring MD) Medicines:             Monitored Anesthesia Care Complications:         No immediate complications. Estimated blood loss: None. Procedure:             Pre-Anesthesia Assessment:                        - Prior to the procedure, a History and Physical was                         performed, and patient medications and allergies were                         reviewed. The patient is competent. The risks and                         benefits of the procedure and the sedation options and                         risks were discussed with the patient. All questions                         were answered and informed consent was obtained.                         Patient identification and proposed procedure were                         verified by the physician, the nurse, the                         anesthesiologist, the anesthetist and the technician                         in the pre-procedure area in the procedure room in the                         endoscopy suite. Mental Status Examination: alert and                         oriented. Airway Examination: normal oropharyngeal  airway and neck mobility. Respiratory Examination:                         clear to auscultation. CV Examination: normal.                         Prophylactic Antibiotics: The patient does not require                        prophylactic antibiotics. Prior Anticoagulants: The                         patient has taken no previous anticoagulant or                         antiplatelet agents. ASA Grade Assessment: I - A                         normal, healthy patient. After reviewing the risks and                         benefits, the patient was deemed in satisfactory                         condition to undergo the procedure. The anesthesia                         plan was to use monitored anesthesia care (MAC).                         Immediately prior to administration of medications,                         the patient was re-assessed for adequacy to receive                         sedatives. The heart rate, respiratory rate, oxygen                         saturations, blood pressure, adequacy of pulmonary                         ventilation, and response to care were monitored                         throughout the procedure. The physical status of the                         patient was re-assessed after the procedure.                        After obtaining informed consent, the colonoscope was                         passed under direct vision. Throughout the procedure,                         the patient's blood pressure, pulse, and oxygen  saturations were monitored continuously. The                         Colonoscope was introduced through the anus and                         advanced to the the cecum, identified by appendiceal                         orifice and ileocecal valve. The colonoscopy was                         performed without difficulty. The patient tolerated                         the procedure well. The quality of the bowel                         preparation was evaluated using the BBPS [facility] Bowel                         Preparation Scale) with scores of: Right Colon = 2                         (minor amount of residual staining, small  fragments of                         stool and/or opaque liquid, but mucosa seen well),                         Transverse Colon = 3 (entire mucosa seen well with no                         residual staining, small fragments of stool or opaque                         liquid) and Left Colon = 3 (entire mucosa seen well                         with no residual staining, small fragments of stool or                         opaque liquid). The total BBPS score equals 8. Findings:      Hemorrhoids were found on perianal exam.      A few diverticula were found in the sigmoid colon and ascending colon.      Non-bleeding external hemorrhoids were found during retroflexion. The       hemorrhoids were large.      The exam was otherwise without abnormality. Impression:            - Hemorrhoids found on perianal exam.                        - Diverticulosis in the sigmoid colon and in the                         ascending colon.                        -  Non-bleeding external hemorrhoids.                        - The examination was otherwise normal.                        - No specimens collected. Recommendation:        - Discharge patient to home (with escort).                        - Resume regular diet today.                        - Continue present medications.                        - Repeat colonoscopy in 5 years for surveillance.                        - Return to GI office as previously scheduled wth Dr                         Vicente Males. Procedure Code(s):     --- Professional ---                        T6546, Colorectal cancer screening; colonoscopy on                         individual at high risk Diagnosis Code(s):     --- Professional ---                        Z80.0, Family history of malignant neoplasm of                         digestive organs                        K64.4, Residual hemorrhoidal skin tags                        K57.30, Diverticulosis of large intestine without                          perforation or abscess without bleeding CPT copyright 2019 American Medical Association. All rights reserved. The codes documented in this report are preliminary and upon coder review may  be revised to meet current compliance requirements. Dr. Ulyess Mort Lin Landsman MD, MD 06/13/2019 11:43:19 AM This report has been signed electronically. Number of Addenda: 0 Note Initiated On: 06/13/2019 11:11 AM Scope Withdrawal Time: 0 hours 10 minutes 8 seconds  Total Procedure Duration: 0 hours 12 minutes 47 seconds  Estimated Blood Loss:  Estimated blood loss: none.      [facility]

## 2019-06-16 ENCOUNTER — Encounter: Payer: Self-pay | Admitting: *Deleted

## 2019-07-10 ENCOUNTER — Ambulatory Visit: Payer: BC Managed Care – PPO | Admitting: Gastroenterology

## 2019-07-10 ENCOUNTER — Telehealth: Payer: Self-pay | Admitting: Gastroenterology

## 2019-07-10 NOTE — Telephone Encounter (Signed)
Patient called & l/m for [place] to cancel his appointment for 07-10-2019 with DR Vicente Males. It was cancelled. I called patient to let him know & ask him to call back to r/s.

## 2019-12-02 ENCOUNTER — Ambulatory Visit: Payer: Self-pay | Admitting: *Deleted

## 2019-12-02 NOTE — Telephone Encounter (Signed)
Patient calling reporting chest tightness, trouble breathing and dizziness so bad he had to leave work today. Per protocol-  ED disposition.  Reason for Disposition  [1] MILD difficulty breathing (e.g., minimal/no SOB at rest, SOB with walking, pulse <100) AND [2] NEW-onset or WORSE than normal  Difficulty breathing  Dizziness or lightheadedness  Answer Assessment - Initial Assessment Questions 1. RESPIRATORY STATUS: "Describe your breathing?" (e.g., wheezing, shortness of breath, unable to speak, severe coughing)      SOB-chest tightness 2. ONSET: "When did this breathing problem begin?"      2 weeks ago- patient had to leave work 3. PATTERN "Does the difficult breathing come and go, or has it been constant since it started?"      Comes and goes 4. SEVERITY: "How bad is your breathing?" (e.g., mild, moderate, severe)    - MILD: No SOB at rest, mild SOB with walking, speaks normally in sentences, can lay down, no retractions, pulse < 100.    - MODERATE: SOB at rest, SOB with minimal exertion and prefers to sit, cannot lie down flat, speaks in phrases, mild retractions, audible wheezing, pulse 100-120.    - SEVERE: Very SOB at rest, speaks in single words, struggling to breathe, sitting hunched forward, retractions, pulse > 120      mild 5. RECURRENT SYMPTOM: "Have you had difficulty breathing before?" If Yes, ask: "When was the last time?" and "What happened that time?"      no 6. CARDIAC HISTORY: "Do you have any history of heart disease?" (e.g., heart attack, angina, bypass surgery, angioplasty)      no 7. LUNG HISTORY: "Do you have any history of lung disease?"  (e.g., pulmonary embolus, asthma, emphysema)     no 8. CAUSE: "What do you think is causing the breathing problem?"      High BP/heart issues 9. OTHER SYMPTOMS: "Do you have any other symptoms? (e.g., dizziness, runny nose, cough, chest pain, fever)     Dizziness-2 weeks, chest pain 10. PREGNANCY: "Is there any chance you  are pregnant?" "When was your last menstrual period?"       n/a 11. TRAVEL: "Have you traveled out of the country in the last month?" (e.g., travel history, exposures)       no  Answer Assessment - Initial Assessment Questions 1. LOCATION: "Where does it hurt?"       Middle- R 2. RADIATION: "Does the pain go anywhere else?" (e.g., into neck, jaw, arms, back)     Goes into back 3. ONSET: "When did the chest pain begin?" (Minutes, hours or days)      1 month 4. PATTERN "Does the pain come and go, or has it been constant since it started?"  "Does it get worse with exertion?"      Comes and goes, get tight when active 5. DURATION: "How long does it last" (e.g., seconds, minutes, hours)     Hours- dull, intense pain- couple of minutes 6. SEVERITY: "How bad is the pain?"  (e.g., Scale 1-10; mild, moderate, or severe)    - MILD (1-3): doesn't interfere with normal activities     - MODERATE (4-7): interferes with normal activities or awakens from sleep    - SEVERE (8-10): excruciating pain, unable to do any normal activities       Can be mild to moderate 7. CARDIAC RISK FACTORS: "Do you have any history of heart problems or risk factors for heart disease?" (e.g., angina, prior heart attack; diabetes, high blood  pressure, high cholesterol, smoker, or strong family history of heart disease)     No- uncle  Heart attack 8. PULMONARY RISK FACTORS: "Do you have any history of lung disease?"  (e.g., blood clots in lung, asthma, emphysema, birth control pills)     no 9. CAUSE: "What do you think is causing the chest pain?"     unsure 10. OTHER SYMPTOMS: "Do you have any other symptoms?" (e.g., dizziness, nausea, vomiting, sweating, fever, difficulty breathing, cough)       Dizziness, headache, sweating 11. PREGNANCY: "Is there any chance you are pregnant?" "When was your last menstrual period?"       n/a  Protocols used: BREATHING DIFFICULTY-A-AH, CHEST PAIN-A-AH

## 2019-12-11 ENCOUNTER — Other Ambulatory Visit: Payer: Self-pay | Admitting: *Deleted

## 2019-12-11 DIAGNOSIS — R7309 Other abnormal glucose: Secondary | ICD-10-CM

## 2019-12-11 DIAGNOSIS — Z0000 Encounter for general adult medical examination without abnormal findings: Secondary | ICD-10-CM

## 2019-12-11 DIAGNOSIS — Z125 Encounter for screening for malignant neoplasm of prostate: Secondary | ICD-10-CM

## 2019-12-11 DIAGNOSIS — E7800 Pure hypercholesterolemia, unspecified: Secondary | ICD-10-CM

## 2019-12-11 DIAGNOSIS — D509 Iron deficiency anemia, unspecified: Secondary | ICD-10-CM

## 2019-12-12 ENCOUNTER — Other Ambulatory Visit: Payer: BC Managed Care – PPO

## 2019-12-13 ENCOUNTER — Ambulatory Visit
Admission: EM | Admit: 2019-12-13 | Discharge: 2019-12-13 | Disposition: A | Discharge status: Home / Self Care | Payer: BC Managed Care – PPO | Attending: Internal Medicine | Admitting: Internal Medicine

## 2019-12-13 ENCOUNTER — Ambulatory Visit: Admission: EM | Admit: 2019-12-13 | Discharge: 2019-12-13 | Discharge status: Absent without leave | Payer: Self-pay

## 2019-12-13 ENCOUNTER — Encounter: Payer: Self-pay | Admitting: Emergency Medicine

## 2019-12-13 ENCOUNTER — Other Ambulatory Visit: Payer: Self-pay

## 2019-12-13 DIAGNOSIS — M779 Enthesopathy, unspecified: Secondary | ICD-10-CM | POA: Diagnosis not present

## 2019-12-13 MED ORDER — DICLOFENAC SODIUM 1 % EX GEL: 2.0000 g | Freq: Four times a day (QID) | CUTANEOUS | 0 refills | Status: AC

## 2019-12-13 NOTE — Discharge Instructions (Signed)
Follow up with your family Dr or [facility] if this persists

## 2019-12-13 NOTE — ED Provider Notes (Signed)
[facility]    CSN: [id number] Arrival date & time: 12/13/19  0947      History   Chief Complaint Chief Complaint  Patient presents with  . Ankle Pain    right    HPI Samuel Davis is a 49 y.o. male who presents with pain on R lateral ankle, and lower leg and achillis x 2 days. Thinks is from work where he is on his feet for 10h and is moving around moving boxes and up and down steps. Has only tried ice. Has to work the next 2 days doing the same and had to leave today.     Past Medical History:  Diagnosis Date  . Wears dentures    partial upper    Patient Active Problem List   Diagnosis Date Noted  . Microcytic anemia 12/13/2018  . Elevated LDL cholesterol level 12/13/2018  . External hemorrhoids without complication 47/65/4650    Past Surgical History:  Procedure Laterality Date  . COLONOSCOPY WITH PROPOFOL N/A 06/13/2019   Procedure: COLONOSCOPY WITH PROPOFOL;  Surgeon: Lin Landsman, MD;  Location: [place];  Service: Endoscopy;  Laterality: N/A;  Priority 4  . THUMB ARTHROSCOPY  05/08/2018   Fracture repair.  [facility]       Home Medications    Prior to Admission medications   Medication Sig Start Date End Date Taking? Authorizing Provider  Cyanocobalamin (VITAMIN B12 PO) Take by mouth daily.    [provider]  diclofenac Sodium (VOLTAREN) 1 % GEL Apply 2 g topically 4 (four) times daily for 7 days. 12/13/19 12/20/19  Rodriguez-Southworth, Sunday Spillers, PA-C  Multiple Vitamins-Minerals (ZINC PO) Take by mouth daily.    [provider]    Family History Family History  Problem Relation Age of Onset  . Colon cancer Father 38       possibly earlier, but not diagnosed  . Hypertension Sister     Social History Social History   Tobacco Use  . Smoking status: Former Smoker    Packs/day: 0.75    Years: 10.00    Pack years: 7.50    Types: Cigarettes    Quit date: 09/08/2017    Years since quitting: 2.2  .  Smokeless tobacco: Former Systems developer  . Tobacco comment: Quit cold [place]  Vaping Use  . Vaping Use: Never used  Substance Use Topics  . Alcohol use: Yes    Alcohol/week: 7.0 standard drinks    Types: 7 Cans of beer per week  . Drug use: Never     Allergies   Patient has no known allergies.   Review of Systems Review of Systems   Physical Exam Triage Vital Signs ED Triage Vitals  Enc Vitals Group     BP 12/13/19 1012 (!) 152/94     Pulse Rate 12/13/19 1012 60     Resp 12/13/19 1012 16     Temp 12/13/19 1012 98.7 F (37.1 C)     Temp Source 12/13/19 1012 Oral     SpO2 12/13/19 1012 99 %     Weight 12/13/19 1011 218 lb (98.9 kg)     Height 12/13/19 1011 6\' 1"  (1.854 m)     Head Circumference --      Peak Flow --      Pain Score 12/13/19 1010 10     Pain Loc --      Pain Edu? --      Excl. in [place]? --    No data found.  Updated Vital Signs BP (!) 152/94 (BP Location: Left Arm)   Pulse 60   Temp 98.7 F (37.1 C) (Oral)   Resp 16   Ht 6\' 1"  (1.854 m)   Wt 218 lb (98.9 kg)   SpO2 99%   BMI 28.76 kg/m   Visual Acuity Right Eye Distance:   Left Eye Distance:   Bilateral Distance:    Right Eye Near:   Left Eye Near:    Bilateral Near:     Physical Exam Constitutional:      General: He is not in acute distress.    Appearance: He is not toxic-appearing.  HENT:     Head: Normocephalic.     Right Ear: External ear normal.     Left Ear: External ear normal.  Eyes:     General: No scleral icterus.    Conjunctiva/sclera: Conjunctivae normal.  Pulmonary:     Effort: Pulmonary effort is normal.  Musculoskeletal:        General: No swelling or deformity. Normal range of motion.     Cervical back: Neck supple.     Right lower leg: No edema.     Left lower leg: No edema.     Comments: R LOWER LEG- has minimal tenderness on the achillis, but moderate over the Peroneus Brevis tender where he has mild swelling over mid area compared to the other leg. Thompson test is  neg. No calf cords or tenderness noted.   Skin:    General: Skin is warm and dry.     Findings: No bruising or rash.  Neurological:     Mental Status: He is alert and oriented to person, place, and time.     Motor: No weakness.     Gait: Gait normal.  Psychiatric:        Mood and Affect: Mood normal.        Behavior: Behavior normal.        Thought Content: Thought content normal.        Judgment: Judgment normal.      UC Treatments / Results  Labs (all labs ordered are listed, but only abnormal results are displayed) Labs Reviewed - No data to display  EKG   Radiology No results found.  Procedures Procedures (including critical care time)  Medications Ordered in UC Medications - No data to display  Initial Impression / Assessment and Plan / UC Course  I have reviewed the triage vital signs and the nursing notes. Has tendonitis or Peroneus brevis. Educated how to not move his foot when he is turning and going up and down steps or elvated areas. Advised to use for 15 min 2-4 times a day and I prescribed him Voltaren gel as noted. Needs to Fu with PCP or ortho. Note  For work given.  Final Clinical Impressions(s) / UC Diagnoses   Final diagnoses:  Tendonitis     Discharge Instructions     Follow up with your family Dr or [facility] if this persists    ED Prescriptions    Medication Sig Dispense Auth. Provider   diclofenac Sodium (VOLTAREN) 1 % GEL Apply 2 g topically 4 (four) times daily for 7 days. 100 g Rodriguez-Southworth, Sunday Spillers, PA-C     PDMP not reviewed this encounter.   Shelby Mattocks, PA-C 12/13/19 1043

## 2019-12-13 NOTE — ED Triage Notes (Signed)
Patient c/o right ankle and achilles pain that started on Thursday.  Patient denies injury or fall.

## 2019-12-19 ENCOUNTER — Encounter: Payer: BC Managed Care – PPO | Admitting: Family Medicine

## 2019-12-19 ENCOUNTER — Encounter: Payer: Self-pay | Admitting: Family Medicine

## 2019-12-19 ENCOUNTER — Ambulatory Visit (INDEPENDENT_AMBULATORY_CARE_PROVIDER_SITE_OTHER): Payer: BC Managed Care – PPO | Admitting: Family Medicine

## 2019-12-19 ENCOUNTER — Other Ambulatory Visit: Payer: Self-pay

## 2019-12-19 VITALS — BP 122/84 | HR 59 | Temp 97.5°F | Resp 16 | Ht 74.0 in | Wt 218.0 lb

## 2019-12-19 DIAGNOSIS — R030 Elevated blood-pressure reading, without diagnosis of hypertension: Secondary | ICD-10-CM | POA: Diagnosis not present

## 2019-12-19 DIAGNOSIS — Z23 Encounter for immunization: Secondary | ICD-10-CM | POA: Diagnosis not present

## 2019-12-19 DIAGNOSIS — E7800 Pure hypercholesterolemia, unspecified: Secondary | ICD-10-CM

## 2019-12-19 DIAGNOSIS — Z0000 Encounter for general adult medical examination without abnormal findings: Secondary | ICD-10-CM

## 2019-12-19 NOTE — Assessment & Plan Note (Signed)
Mildly elevated initial BP, repeat manual check improved. Prior history. - Home BP readings none - has cuff  No known complications  Fam history Sister   Plan:  1. No medication 2. Encourage improved lifestyle - low sodium diet, regular exercise 3. Start monitor BP outside office, bring readings to next visit, if persistently >140/90 or new symptoms notify office sooner  Return sooner if elevated BP, discuss may need low dose medication

## 2019-12-19 NOTE — Assessment & Plan Note (Signed)
Improved LDL on last lab, with lifestyle improvement Not on medicine  Check lab today

## 2019-12-19 NOTE — Patient Instructions (Addendum)
Thank you for coming to the office today.  Labs today  Stay tuned for results on mychart.  Keep track of your Blood Pressure - check it 3-4 times a week, approximately, write down readings.  Goal is < 140 / 90  Preferred goal is < 135 / 85  If you see higher readings, or new concerns - please come back sooner, we can discuss BP medication and treatment options    Please schedule a Follow-up Appointment to: Return in about 1 year (around 12/18/2020) for 1 year for Annual Physical (AM apt fasting lab AFTER).  If you have any other questions or concerns, please feel free to call the office or send a message through [place]. You may also schedule an earlier appointment if necessary.  Additionally, you may be receiving a survey about your experience at our office within a few days to 1 week by e-mail or mail. We value your feedback.  Nobie Putnam, DO [place]

## 2019-12-19 NOTE — Progress Notes (Signed)
Subjective:    Patient ID: Samuel Davis, male    DOB: 06-23-1970, 49 y.o.   MRN: [id number]  Samuel Davis is a 49 y.o. male presenting on 12/19/2019 for Annual Exam   HPI    Here for Annual Physical and Fasting Lab Orders Today  Wellness / Lifestyle  Diet: Reduced beef and pork, more vegetables, limited bread. Drinks mostly water, some protein supplement No salt added. No caffeine. He drinks alcohol. Exercise: regular exercise increased. He is sleeping well, no concerns.  HYPERLIPIDEMIA: - Reports no concerns. Last lipid panel 11/2018 - due for labs today - Not on any cholesterol medication  Chronic Anemia, microcytic / Low WBC Prior results compared with stable slightly declined Hgb and WBC, previous history dating back to >20 yr ago with similar results. He is unaware of other family member with similar issues.  Elevated BP without HTN Reports has BP cuff, not checking. History of elevated BP readings, usually re-check improved Current Meds - None, never on   Denies CP, dyspnea, HA, edema, dizziness / lightheadedness   Additional concern  Achilles tendonitis, has diclofenac with improvement.   Health Maintenance:  Prostate CA Screening: No prior prostate CA screening.Currently asymptomatic.No known family history of prostate CA.Defer screeningat this time. He is interested to add test next year for PSA  Colon CA Screening: Last Colonoscopy 06/13/19 Dr Vicente Males AGI - no polyps, repeat 5 years. History of hematochezia. Father Colon CA age 79. Next due 06/2024  UTD TDap 12/2018  Due for Flu Shot, wanted it but then declined today. Will return.  COVID19 Vaccine updated April, [place].  Depression screen [facility] 2/9 12/19/2019 12/13/2018 12/13/2018  Decreased Interest 0 0 0  Down, Depressed, Hopeless 0 0 0  PHQ - 2 Score 0 0 0    Past Medical History:  Diagnosis Date  . Wears dentures    partial upper   Past Surgical History:  Procedure Laterality Date  .  COLONOSCOPY WITH PROPOFOL N/A 06/13/2019   Procedure: COLONOSCOPY WITH PROPOFOL;  Surgeon: Lin Landsman, MD;  Location: [place];  Service: Endoscopy;  Laterality: N/A;  Priority 4  . THUMB ARTHROSCOPY  05/08/2018   Fracture repair.  [facility]   Social History   Socioeconomic History  . Marital status: Single    Spouse name: Not on file  . Number of children: Not on file  . Years of education: [place], [place]  . Highest education level: Associate degree: occupational, Hotel manager, or vocational program  Occupational History  . Occupation: Distribution  Tobacco Use  . Smoking status: Former Smoker    Packs/day: 0.75    Years: 10.00    Pack years: 7.50    Types: Cigarettes    Quit date: 09/08/2017    Years since quitting: 2.2  . Smokeless tobacco: Former Systems developer  . Tobacco comment: Quit cold [place]  Vaping Use  . Vaping Use: Never used  Substance and Sexual Activity  . Alcohol use: Yes    Alcohol/week: 7.0 standard drinks    Types: 7 Cans of beer per week  . Drug use: Never  . Sexual activity: Not on file  Other Topics Concern  . Not on file  Social History Narrative  . Not on file   Social Determinants of Health   Financial Resource Strain:   . Difficulty of Paying Living Expenses: Not on file  Food Insecurity:   . Worried About Charity fundraiser in the Last Year: Not on file  . Ran  Out of Food in the Last Year: Not on file  Transportation Needs:   . Lack of Transportation (Medical): Not on file  . Lack of Transportation (Non-Medical): Not on file  Physical Activity:   . Days of Exercise per Week: Not on file  . Minutes of Exercise per Session: Not on file  Stress:   . Feeling of Stress : Not on file  Social Connections:   . Frequency of Communication with Friends and Family: Not on file  . Frequency of Social Gatherings with Friends and Family: Not on file  . Attends Religious Services: Not on file  . Active Member of Clubs or Organizations: Not on  file  . Attends Archivist Meetings: Not on file  . Marital Status: Not on file  Intimate Partner Violence:   . Fear of Current or Ex-Partner: Not on file  . Emotionally Abused: Not on file  . Physically Abused: Not on file  . Sexually Abused: Not on file   Family History  Problem Relation Age of Onset  . Colon cancer Father 19       possibly earlier, but not diagnosed  . Hypertension Sister    Current Outpatient Medications on File Prior to Visit  Medication Sig  . diclofenac Sodium (VOLTAREN) 1 % GEL Apply 2 g topically 4 (four) times daily for 7 days. (Patient not taking: Reported on 12/19/2019)   No current facility-administered medications on file prior to visit.    Review of Systems  Constitutional: Negative for activity change, appetite change, chills, diaphoresis, fatigue and fever.  HENT: Negative for congestion and hearing loss.   Eyes: Negative for visual disturbance.  Respiratory: Negative for apnea, cough, chest tightness, shortness of breath and wheezing.   Cardiovascular: Negative for chest pain, palpitations and leg swelling.  Gastrointestinal: Negative for abdominal pain, anal bleeding, blood in stool, constipation, diarrhea, nausea and vomiting.  Genitourinary: Negative for difficulty urinating, dysuria, frequency and hematuria.  Musculoskeletal: Negative for arthralgias, back pain and neck pain.  Skin: Negative for rash.  Allergic/Immunologic: Negative for environmental allergies.  Neurological: Negative for dizziness, weakness, light-headedness, numbness and headaches.  Hematological: Negative for adenopathy.  Psychiatric/Behavioral: Negative for behavioral problems, dysphoric mood and sleep disturbance. The patient is not nervous/anxious.    Per HPI unless specifically indicated above      Objective:    BP 122/84 (BP Location: Left Arm, Cuff Size: Normal)   Pulse (!) 59   Temp (!) 97.5 F (36.4 C) (Temporal)   Resp 16   Ht 6\' 2"  (1.88 m)    Wt 218 lb (98.9 kg)   SpO2 99%   BMI 27.99 kg/m   Wt Readings from Last 3 Encounters:  12/19/19 218 lb (98.9 kg)  12/13/19 218 lb (98.9 kg)  06/13/19 214 lb (97.1 kg)    Physical Exam Vitals and nursing note reviewed.  Constitutional:      General: He is not in acute distress.    Appearance: He is well-developed. He is not diaphoretic.     Comments: Well-appearing, comfortable, cooperative  HENT:     Head: Normocephalic and atraumatic.     Right Ear: Tympanic membrane, ear canal and external ear normal. There is no impacted cerumen.     Left Ear: Tympanic membrane, ear canal and external ear normal. There is no impacted cerumen.  Eyes:     General:        Right eye: No discharge.        Left eye:  No discharge.     Conjunctiva/sclera: Conjunctivae normal.     Pupils: Pupils are equal, round, and reactive to light.  Neck:     Thyroid: No thyromegaly.     Vascular: No carotid bruit.  Cardiovascular:     Rate and Rhythm: Normal rate and regular rhythm.     Heart sounds: Normal heart sounds. No murmur heard.   Pulmonary:     Effort: Pulmonary effort is normal. No respiratory distress.     Breath sounds: Normal breath sounds. No wheezing or rales.  Abdominal:     General: Bowel sounds are normal. There is no distension.     Palpations: Abdomen is soft. There is no mass.     Tenderness: There is no abdominal tenderness.  Musculoskeletal:        General: No tenderness. Normal range of motion.     Cervical back: Normal range of motion and neck supple.     Comments: Upper / Lower Extremities: - Normal muscle tone, strength bilateral upper extremities 5/5, lower extremities 5/5  Lymphadenopathy:     Cervical: No cervical adenopathy.  Skin:    General: Skin is warm and dry.     Findings: No erythema or rash.  Neurological:     Mental Status: He is alert and oriented to person, place, and time.     Comments: Distal sensation intact to light touch all extremities  Psychiatric:         Behavior: Behavior normal.     Comments: Well groomed, good eye contact, normal speech and thoughts    Results for orders placed or performed during the hospital encounter of 06/11/19  SARS CORONAVIRUS 2 (TAT 6-24 HRS) Nasopharyngeal Nasopharyngeal Swab   Specimen: Nasopharyngeal Swab  Result Value Ref Range   SARS Coronavirus 2 NEGATIVE NEGATIVE      Assessment & Plan:   Problem List Items Addressed This Visit    Elevated LDL cholesterol level    Improved LDL on last lab, with lifestyle improvement Not on medicine  Check lab today      Elevated BP without diagnosis of hypertension    Mildly elevated initial BP, repeat manual check improved. Prior history. - Home BP readings none - has cuff  No known complications  Fam history Sister   Plan:  1. No medication 2. Encourage improved lifestyle - low sodium diet, regular exercise 3. Start monitor BP outside office, bring readings to next visit, if persistently >140/90 or new symptoms notify office sooner  Return sooner if elevated BP, discuss may need low dose medication       Other Visit Diagnoses    Annual physical exam    -  Primary   Needs flu shot          Updated Health Maintenance information - Return for Flu Shot - Check labs, PSA - Next Colonoscopy 5 year 06/2024 Labs drawn today, follow up results Encouraged improvement to lifestyle with diet and exercise - Goal of weight loss   No orders of the defined types were placed in this encounter.     Follow up plan: Return in about 1 year (around 12/18/2020) for 1 year for Annual Physical (AM apt fasting lab AFTER).  Nobie Putnam, [facility] 12/19/2019, 9:31 AM

## 2019-12-20 LAB — COMPLETE METABOLIC PANEL WITH GFR
AG Ratio: 2.1 (calc) (ref 1.0–2.5)
ALT: 38 U/L (ref 9–46)
AST: 33 U/L (ref 10–40)
Albumin: 4.6 g/dL (ref 3.6–5.1)
Alkaline phosphatase (APISO): 39 U/L (ref 36–130)
BUN: 14 mg/dL (ref 7–25)
CO2: 26 mmol/L (ref 20–32)
Calcium: 9.2 mg/dL (ref 8.6–10.3)
Chloride: 104 mmol/L (ref 98–110)
Creat: 0.85 mg/dL (ref 0.60–1.35)
GFR, Est African American: 119 mL/min/{1.73_m2} (ref >=60)
GFR, Est Non African American: 103 mL/min/{1.73_m2} (ref >=60)
Globulin: 2.2 g/dL (calc) (ref 1.9–3.7)
Glucose, Bld: 98 mg/dL (ref 65–99)
Potassium: 4.4 mmol/L (ref 3.5–5.3)
Sodium: 138 mmol/L (ref 135–146)
Total Bilirubin: 0.6 mg/dL (ref 0.2–1.2)
Total Protein: 6.8 g/dL (ref 6.1–8.1)

## 2019-12-20 LAB — CBC WITH DIFFERENTIAL/PLATELET
Absolute Monocytes: 319 cells/uL (ref 200–950)
Basophils Absolute: 31 cells/uL (ref 0–200)
Basophils Relative: 1 %
Eosinophils Absolute: 130 cells/uL (ref 15–500)
Eosinophils Relative: 4.2 %
HCT: 42.2 % (ref 38.5–50.0)
Hemoglobin: 12.7 g/dL — ABNORMAL LOW (ref 13.2–17.1)
Lymphs Abs: 1345 cells/uL (ref 850–3900)
MCH: 22.8 pg — ABNORMAL LOW (ref 27.0–33.0)
MCHC: 30.1 g/dL — ABNORMAL LOW (ref 32.0–36.0)
MCV: 75.6 fL — ABNORMAL LOW (ref 80.0–100.0)
MPV: 10.4 fL (ref 7.5–12.5)
Monocytes Relative: 10.3 %
Neutro Abs: 1274 cells/uL — ABNORMAL LOW (ref 1500–7800)
Neutrophils Relative %: 41.1 %
Platelets: 163 10*3/uL (ref 140–400)
RBC: 5.58 10*6/uL (ref 4.20–5.80)
RDW: 15.3 % — ABNORMAL HIGH (ref 11.0–15.0)
Total Lymphocyte: 43.4 %
WBC: 3.1 10*3/uL — ABNORMAL LOW (ref 3.8–10.8)

## 2019-12-20 LAB — HEMOGLOBIN A1C
Hgb A1c MFr Bld: 5.7 % of total Hgb — ABNORMAL HIGH (ref <5.7)
Mean Plasma Glucose: 117 (calc)
eAG (mmol/L): 6.5 (calc)

## 2019-12-20 LAB — LIPID PANEL
Cholesterol: 235 mg/dL — ABNORMAL HIGH (ref <200)
HDL: 88 mg/dL (ref >=40)
LDL Cholesterol (Calc): 132 mg/dL (calc) — ABNORMAL HIGH
Non-HDL Cholesterol (Calc): 147 mg/dL (calc) — ABNORMAL HIGH (ref <130)
Total CHOL/HDL Ratio: 2.7 (calc) (ref <5.0)
Triglycerides: 62 mg/dL (ref <150)

## 2019-12-20 LAB — PSA: PSA: 0.4 ng/mL (ref <=4.0)

## 2020-02-23 ENCOUNTER — Ambulatory Visit: Payer: BC Managed Care – PPO | Admitting: Family Medicine

## 2020-02-27 ENCOUNTER — Ambulatory Visit (INDEPENDENT_AMBULATORY_CARE_PROVIDER_SITE_OTHER): Payer: BC Managed Care – PPO | Admitting: Family Medicine

## 2020-02-27 ENCOUNTER — Other Ambulatory Visit: Payer: Self-pay

## 2020-02-27 ENCOUNTER — Encounter: Payer: Self-pay | Admitting: Family Medicine

## 2020-02-27 VITALS — BP 150/94 | HR 64 | Temp 97.3°F | Resp 16 | Ht 74.0 in | Wt 220.0 lb

## 2020-02-27 DIAGNOSIS — M7662 Achilles tendinitis, left leg: Secondary | ICD-10-CM | POA: Diagnosis not present

## 2020-02-27 DIAGNOSIS — L308 Other specified dermatitis: Secondary | ICD-10-CM | POA: Diagnosis not present

## 2020-02-27 DIAGNOSIS — Z87898 Personal history of other specified conditions: Secondary | ICD-10-CM

## 2020-02-27 MED ORDER — SCOPOLAMINE 1 MG/3DAYS TD PT72: 1.0000 | MEDICATED_PATCH | TRANSDERMAL | 0 refills | Status: DC

## 2020-02-27 MED ORDER — DICLOFENAC SODIUM 1 % EX GEL: 2.0000 g | Freq: Four times a day (QID) | CUTANEOUS | 2 refills | Status: DC

## 2020-02-27 MED ORDER — TRIAMCINOLONE ACETONIDE 0.5 % EX CREA: 1.0000 "application " | TOPICAL_CREAM | Freq: Two times a day (BID) | CUTANEOUS | 1 refills | Status: DC

## 2020-02-27 NOTE — Patient Instructions (Addendum)
Thank you for coming to the office today.  Patch 2 to 12 hours BEFORE you get on plane.  Use coupon for Diclofenac gel for achilles  Try the triamcinolone for rash on buttocks, does not look like a bruise at this time.  Use for about 1-2 weeks twice  A day.  Please schedule a Follow-up Appointment to: Return if symptoms worsen or fail to improve.  If you have any other questions or concerns, please feel free to call the office or send a message through [place]. You may also schedule an earlier appointment if necessary.  Additionally, you may be receiving a survey about your experience at our office within a few days to 1 week by e-mail or mail. We value your feedback.  Nobie Putnam, DO [facility]

## 2020-02-27 NOTE — Progress Notes (Signed)
Subjective:    Patient ID: Samuel Davis, male    DOB: [birth year], 49 y.o.   MRN: [id number]  Samuel Davis is a 49 y.o. male presenting on 02/27/2020 for bruise (Left side buttock area as per patient had fall in [facility] onset 2 months but as per wife it is turning into knot)   HPI   Left Buttock Bruise vs Rash Recent fall injury 2 months ago off exercise equipment, fell directly onto a ledge of other equipment and had bruise. Then it improved Now has area of slight discoloration in 2-3 locations of buttocks, non tender.  Motion Sickness Traveling to [place] in 3 weeks. Asking about anxiety and motion sickness flying on plane scopolomine patches  Achilles tendonitis Localized area of soreness from repetitive activity on achilles, denies swelling or difficulty walking or pain with weight bear.  Health Maintenance: UTD COVID booster Pfizer and Flu 02/21/20  Depression screen [facility] 2/9 12/19/2019 12/13/2018 12/13/2018  Decreased Interest 0 0 0  Down, Depressed, Hopeless 0 0 0  PHQ - 2 Score 0 0 0    Social History   Tobacco Use  . Smoking status: Former Smoker    Packs/day: 0.75    Years: 10.00    Pack years: 7.50    Types: Cigarettes    Quit date: 09/08/2017    Years since quitting: 2.4  . Smokeless tobacco: Former Systems developer  . Tobacco comment: Quit cold [place]  Vaping Use  . Vaping Use: Never used  Substance Use Topics  . Alcohol use: Yes    Alcohol/week: 7.0 standard drinks    Types: 7 Cans of beer per week  . Drug use: Never    Review of Systems Per HPI unless specifically indicated above     Objective:    BP (!) 150/94   Pulse 64   Temp (!) 97.3 F (36.3 C) (Temporal)   Resp 16   Ht 6\' 2"  (1.88 m)   Wt 220 lb (99.8 kg)   SpO2 99%   BMI 28.25 kg/m   Wt Readings from Last 3 Encounters:  02/27/20 220 lb (99.8 kg)  12/19/19 218 lb (98.9 kg)  12/13/19 218 lb (98.9 kg)    Physical Exam Vitals and nursing note reviewed.  Constitutional:      General: He is not in  acute distress.    Appearance: He is well-developed. He is not diaphoretic.     Comments: Well-appearing, comfortable, cooperative  HENT:     Head: Normocephalic and atraumatic.  Eyes:     General:        Right eye: No discharge.        Left eye: No discharge.     Conjunctiva/sclera: Conjunctivae normal.  Cardiovascular:     Rate and Rhythm: Normal rate.  Pulmonary:     Effort: Pulmonary effort is normal.  Musculoskeletal:     Comments: Achilles tendons non tender on exam, no deformity  Skin:    General: Skin is warm and dry.     Findings: Rash (patchy slightly raised scaly darker patchy area on buttocks - not consistent with ecchymosis, non tender, several patchy areas bilateral) present. No erythema.  Neurological:     Mental Status: He is alert and oriented to person, place, and time.  Psychiatric:        Behavior: Behavior normal.     Comments: Well groomed, good eye contact, normal speech and thoughts      Results for orders placed or performed in visit on  12/11/19  PSA  Result Value Ref Range   PSA 0.4 < OR = 4.0 ng/mL  Lipid panel  Result Value Ref Range   Cholesterol 235 (H) <200 mg/dL   HDL 88 > OR = 40 mg/dL   Triglycerides 62 <150 mg/dL   LDL Cholesterol (Calc) 132 (H) mg/dL (calc)   Total CHOL/HDL Ratio 2.7 <5.0 (calc)   Non-HDL Cholesterol (Calc) 147 (H) <130 mg/dL (calc)  COMPLETE METABOLIC PANEL WITH GFR  Result Value Ref Range   Glucose, Bld 98 65 - 99 mg/dL   BUN 14 7 - 25 mg/dL   Creat 0.85 0.60 - 1.35 mg/dL   GFR, Est Non African American 103 > OR = 60 mL/min/1.70m2   GFR, Est African American 119 > OR = 60 mL/min/1.34m2   BUN/Creatinine Ratio NOT APPLICABLE 6 - 22 (calc)   Sodium 138 135 - 146 mmol/L   Potassium 4.4 3.5 - 5.3 mmol/L   Chloride 104 98 - 110 mmol/L   CO2 26 20 - 32 mmol/L   Calcium 9.2 8.6 - 10.3 mg/dL   Total Protein 6.8 6.1 - 8.1 g/dL   Albumin 4.6 3.6 - 5.1 g/dL   Globulin 2.2 1.9 - 3.7 g/dL (calc)   AG Ratio 2.1 1.0 - 2.5  (calc)   Total Bilirubin 0.6 0.2 - 1.2 mg/dL   Alkaline phosphatase (APISO) 39 36 - 130 U/L   AST 33 10 - 40 U/L   ALT 38 9 - 46 U/L  CBC with Differential/Platelet  Result Value Ref Range   WBC 3.1 (L) 3.8 - 10.8 Thousand/uL   RBC 5.58 4.20 - 5.80 Million/uL   Hemoglobin 12.7 (L) 13.2 - 17.1 g/dL   HCT 42.2 38 - 50 %   MCV 75.6 (L) 80.0 - 100.0 fL   MCH 22.8 (L) 27.0 - 33.0 pg   MCHC 30.1 (L) 32.0 - 36.0 g/dL   RDW 15.3 (H) 11.0 - 15.0 %   Platelets 163 140 - 400 Thousand/uL   MPV 10.4 7.5 - 12.5 fL   Neutro Abs 1,274 (L) 1,500 - 7,800 cells/uL   Lymphs Abs 1,345 850 - 3,900 cells/uL   Absolute Monocytes 319 200 - 950 cells/uL   Eosinophils Absolute 130 15.0 - 500.0 cells/uL   Basophils Absolute 31 0.0 - 200.0 cells/uL   Neutrophils Relative % 41.1 %   Total Lymphocyte 43.4 %   Monocytes Relative 10.3 %   Eosinophils Relative 4.2 %   Basophils Relative 1.0 %  Hemoglobin A1c  Result Value Ref Range   Hgb A1c MFr Bld 5.7 (H) <5.7 % of total Hgb   Mean Plasma Glucose 117 (calc)   eAG (mmol/L) 6.5 (calc)      Assessment & Plan:   Problem List Items Addressed This Visit    None    Visit Diagnoses    Other eczema    -  Primary  Localized patches of eczema vs dry skin on buttocks, not in skin fold, not candida, and no sign of ecchymosis Reassurance Trial triamcinolone    Relevant Medications   triamcinolone cream (KENALOG) 0.5 %   History of motion sickness      Associated w/ flying plane Rx scopolomine patches, instructions given    Relevant Medications   scopolamine (TRANSDERM-SCOP) 1 MG/3DAYS   Achilles tendinitis of left lower extremity      Localized area of soreness on achilles, not on exam Trial topical NSAID PRN relief, relative rest avoid over exertion   Relevant  Medications   diclofenac Sodium (VOLTAREN) 1 % GEL        Meds ordered this encounter  Medications  . diclofenac Sodium (VOLTAREN) 1 % GEL    Sig: Apply 2 g topically 4 (four) times  daily.    Dispense:  100 g    Refill:  2  . scopolamine (TRANSDERM-SCOP) 1 MG/3DAYS    Sig: Place 1 patch (1.5 mg total) onto the skin every 3 (three) days. For motion sickness. Start 2 hr before onset symptoms, up to 12 hr before    Dispense:  4 patch    Refill:  0  . triamcinolone cream (KENALOG) 0.5 %    Sig: Apply 1 application topically 2 (two) times daily. To affected areas, for up to 2 weeks.    Dispense:  30 g    Refill:  1      Follow up plan: Return if symptoms worsen or fail to improve.   Nobie Putnam, DO [facility] 02/27/2020, 8:59 AM

## 2020-04-16 ENCOUNTER — Ambulatory Visit: Payer: BC Managed Care – PPO | Admitting: Podiatry

## 2020-04-16 ENCOUNTER — Other Ambulatory Visit: Payer: Self-pay

## 2020-04-16 DIAGNOSIS — B351 Tinea unguium: Secondary | ICD-10-CM

## 2020-04-16 DIAGNOSIS — D492 Neoplasm of unspecified behavior of bone, soft tissue, and skin: Secondary | ICD-10-CM | POA: Diagnosis not present

## 2020-04-16 DIAGNOSIS — Q828 Other specified congenital malformations of skin: Secondary | ICD-10-CM | POA: Diagnosis not present

## 2020-04-16 MED ORDER — TERBINAFINE HCL 250 MG PO TABS: 250.0000 mg | ORAL_TABLET | Freq: Every day | ORAL | 0 refills | Status: DC

## 2020-04-16 NOTE — Progress Notes (Signed)
   Subjective: 50 y.o. male presenting to the office today for evaluation of a symptomatic lesion to the lateral aspect of the right heel that is been going on for several months now.  Patient states that he works on his feet all day long at [employer] and his experience pain and tenderness to the lateral aspect of the right heel.  He presents for further treatment and evaluation  Patient also states that he has been having some discoloration and thickening of the toenails bilaterally.  He gets routine pedicures and he noticed that over time he has developed discoloration with thickening of the nails.  He would like to have them treated and evaluated   Past Medical History:  Diagnosis Date  . Wears dentures    partial upper     Objective:  Physical Exam General: Alert and oriented x3 in no acute distress  Dermatology: Hyperkeratotic lesion(s) present on the lateral aspect of the right heel. Pain on palpation with a central nucleated core noted. Skin is warm, dry and supple bilateral lower extremities. Negative for open lesions or macerations. There is also hyperkeratotic dystrophic nails noted 1-5 bilateral  Vascular: Palpable pedal pulses bilaterally. No edema or erythema noted. Capillary refill within normal limits.  Neurological: Epicritic and protective threshold grossly intact bilaterally.   Musculoskeletal Exam: Pain on palpation at the keratotic lesion(s) noted. Range of motion within normal limits bilateral. Muscle strength 5/5 in all groups bilateral.  Assessment: 1.  Onychomycosis of toenails bilateral 2.  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 Patient evaluated 2. Excisional debridement of keratoic lesion(s) using a chisel blade was performed without incident.  3.  Recommend OTC corn and callus remover 4.  Prescription for Lamisil 250 mg #90.  Patient denies any past medical history of liver problems or any liver  symptoms. 5.  Return to clinic as needed  Edrick Kins, DPM [facility]  Dr. Edrick Kins, [address]                                        [place], [place] [address]                Office [phone]  Fax [phone]

## 2020-05-28 ENCOUNTER — Ambulatory Visit: Payer: BC Managed Care – PPO | Admitting: Family Medicine

## 2020-10-03 IMAGING — DX DG HAND COMPLETE 3+V*R*
3 series · 3 of 3 positions shown · non-contrast
Comparison: None.

CLINICAL DATA: RIGHT hand injury yesterday, pain and swelling.

EXAM:
RIGHT HAND - COMPLETE 3+ VIEW

[hand ap]
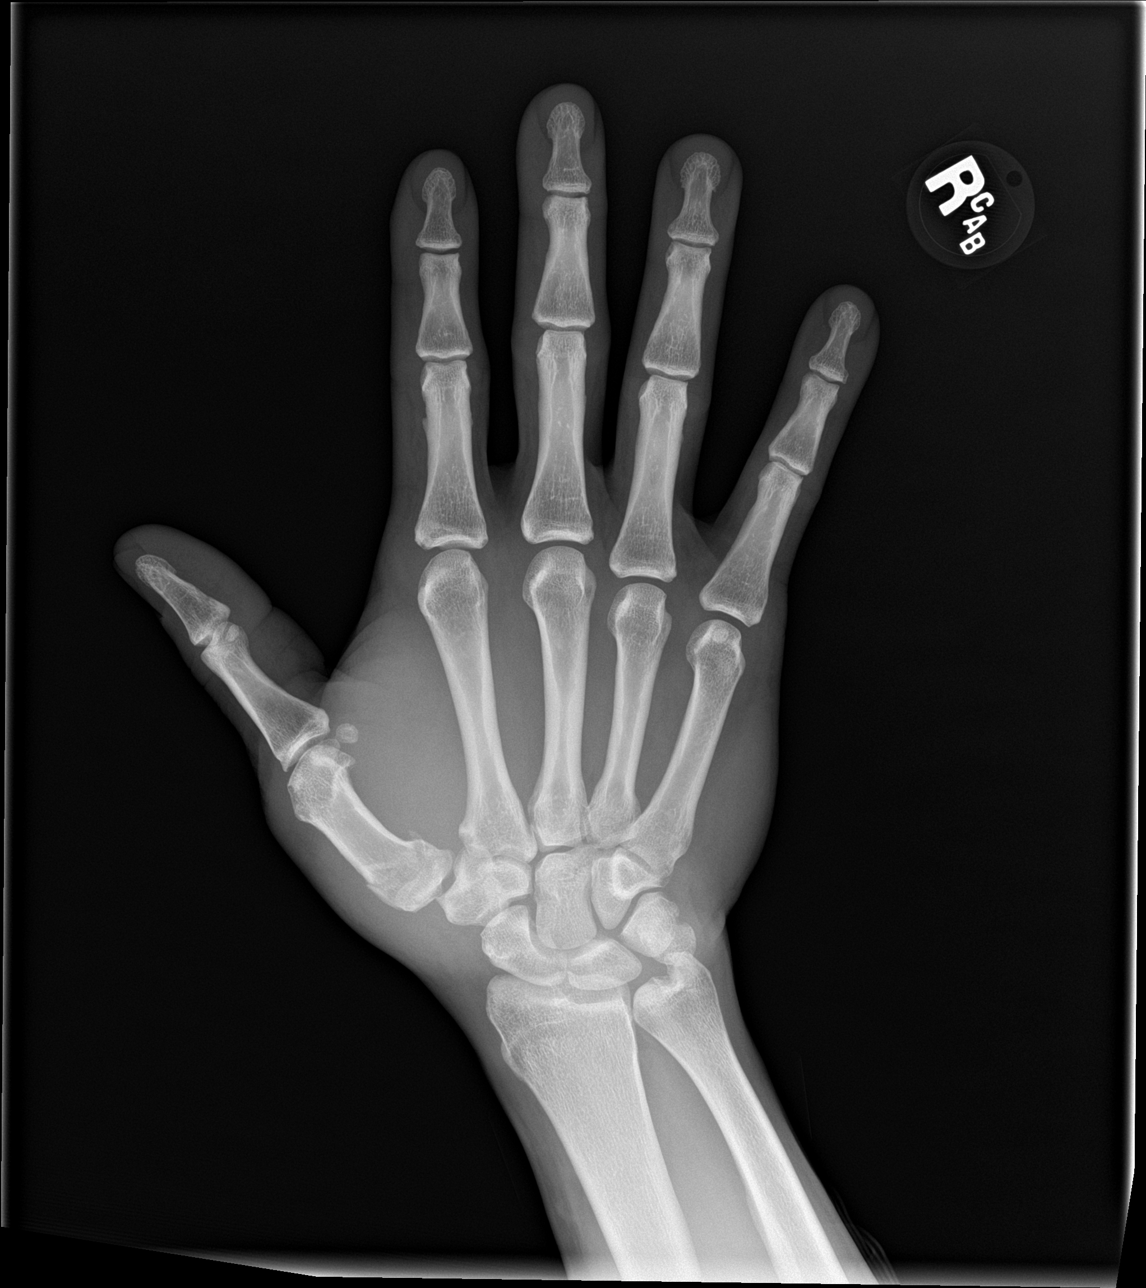

[hand obl]
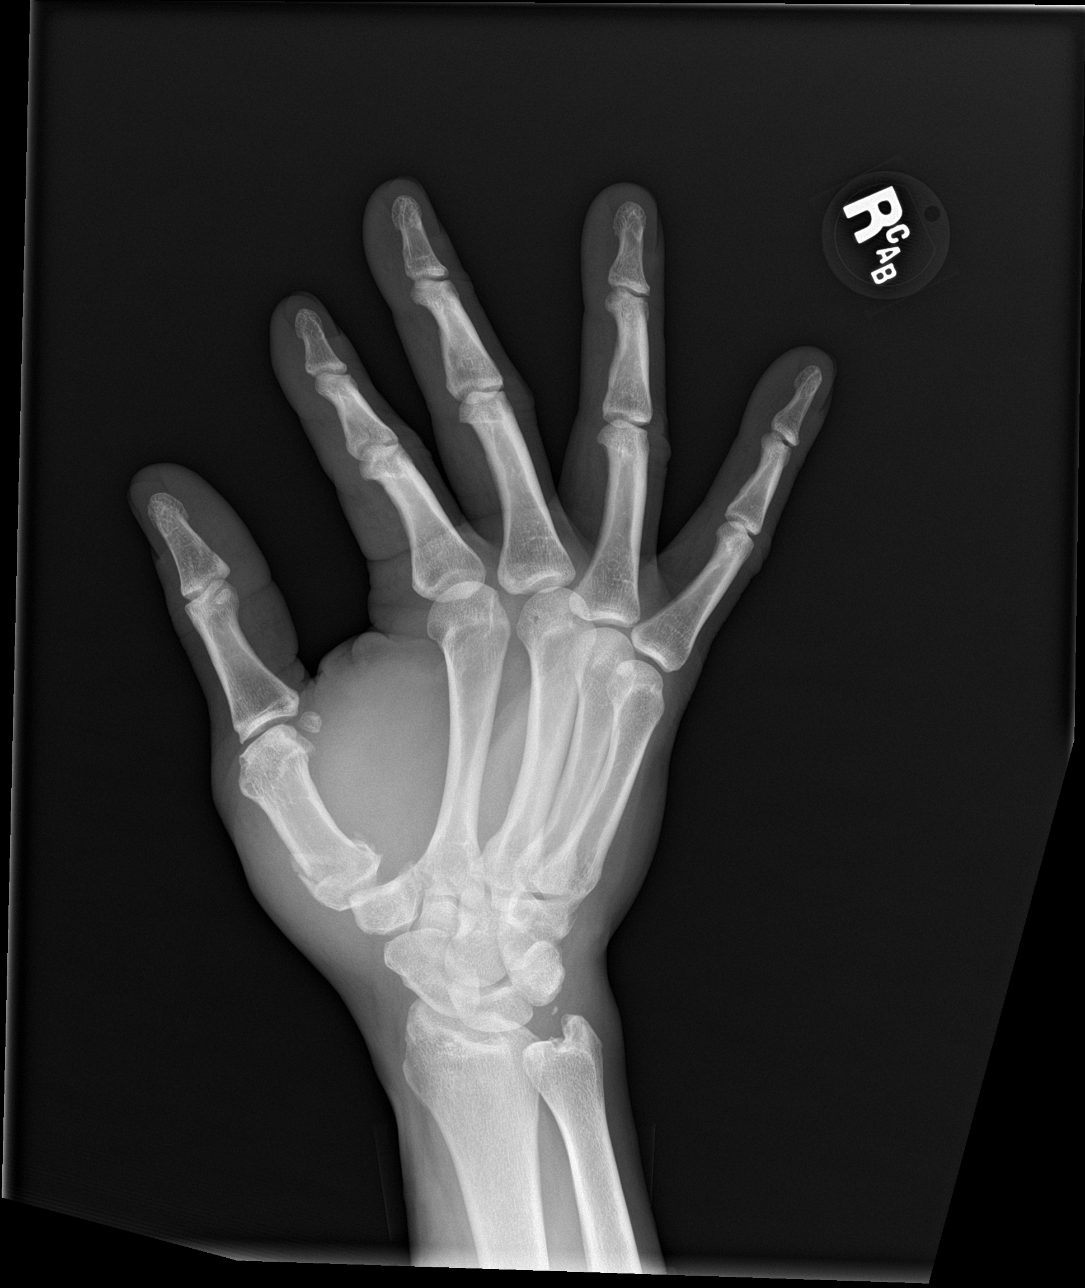

[hand lat]
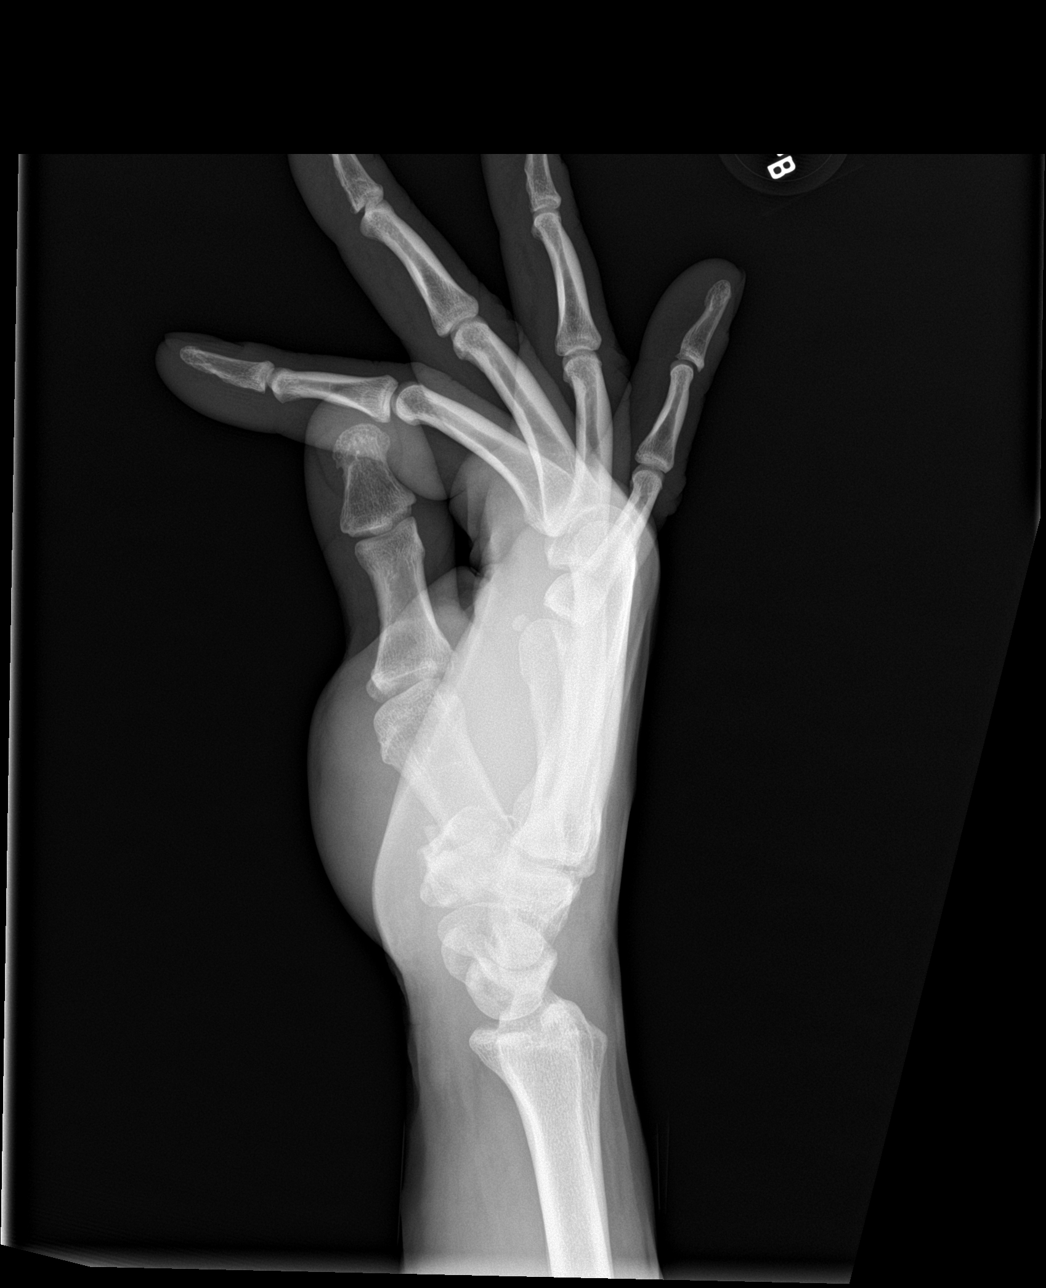

[3 of 3 positions shown; findings below may reference images not displayed]

FINDINGS: Displaced/comminuted fracture at the base of the RIGHT first
metacarpal bone, likely with some degree of associated impaction at
the fracture site.

Additional slight irregularity at the distal margin of the
trapezium, suspicious for additional mildly displaced avulsion
fracture.

Remainder of the osseous structures of the RIGHT hand appear intact
and normally aligned.
IMPRESSION: 1. Displaced/comminuted fracture at the base of the RIGHT first
metacarpal bone, likely with some degree of impaction. No convincing
fracture line extension to the articular surface at the base of the
first metacarpal bone.
2. Mild radial subluxation of the first metacarpal bone relative to
the underlying trapezium, suspicious for associated ligamentous
injury.
3. Slight irregularity at the distal margin of the underlying
trapezium, suspicious for additional mildly displaced avulsion
fracture.

## 2020-10-04 ENCOUNTER — Ambulatory Visit: Payer: BC Managed Care – PPO | Admitting: Family Medicine

## 2020-10-04 ENCOUNTER — Other Ambulatory Visit: Payer: Self-pay

## 2020-10-04 ENCOUNTER — Encounter: Payer: Self-pay | Admitting: Family Medicine

## 2020-10-04 VITALS — BP 140/82 | HR 65 | Ht 74.0 in | Wt 210.2 lb

## 2020-10-04 DIAGNOSIS — M25431 Effusion, right wrist: Secondary | ICD-10-CM | POA: Diagnosis not present

## 2020-10-04 NOTE — Progress Notes (Signed)
Subjective:    Patient ID: Samuel Davis, male    DOB: April 06, 1971, 50 y.o.   MRN: [id number]  Samuel Davis is a 50 y.o. 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 Now no longer tingling, seems swelling has improved some, he uses ice packs. No bruising or ecchymosis, no erythema, ulceration, other swelling Admits occasional tingling into forearm.  Depression screen [facility] 2/9 10/04/2020 12/19/2019 12/13/2018  Decreased Interest 0 0 0  Down, Depressed, Hopeless 0 0 0  PHQ - 2 Score 0 0 0  Altered sleeping 0 - -  Tired, decreased energy 0 - -  Change in appetite 0 - -  Feeling bad or failure about yourself  0 - -  Trouble concentrating 0 - -  Moving slowly or fidgety/restless 0 - -  Suicidal thoughts 0 - -  PHQ-9 Score 0 - -  Difficult doing work/chores Not difficult at all - -    Social History   Tobacco Use   Smoking status: Former    Packs/day: 0.75    Years: 10.00    Pack years: 7.50    Types: Cigarettes    Quit date: 09/08/2017    Years since quitting: 3.0   Smokeless tobacco: Former   Tobacco comments:    Quit cold [place]  Scientific laboratory technician Use: Never used  Substance Use Topics   Alcohol use: Yes    Alcohol/week: 7.0 standard drinks    Types: 7 Cans of beer per week   Drug use: Never    Review of Systems Per HPI unless specifically indicated above     Objective:    BP 140/82   Pulse 65   Ht 6\' 2"  (1.88 m)   Wt 210 lb 3.2 oz (95.3 kg)   SpO2 97%   BMI 26.99 kg/m   Wt Readings from Last 3 Encounters:  10/04/20 210 lb 3.2 oz (95.3 kg)  02/27/20 220 lb (99.8 kg)  12/19/19 218 lb (98.9 kg)    Physical Exam Vitals and nursing note reviewed.  Constitutional:      General: He is not in acute distress.    Appearance: Normal appearance. He is well-developed. He  is not diaphoretic.     Comments: Well-appearing, comfortable, cooperative  HENT:     Head: Normocephalic and atraumatic.  Eyes:     General:        Right eye: No discharge.        Left eye: No discharge.     Conjunctiva/sclera: Conjunctivae normal.  Cardiovascular:     Rate and Rhythm: Normal rate.  Pulmonary:     Effort: Pulmonary effort is normal.  Musculoskeletal:     Comments: Right inner palmar wrist with localized edema radial area near radial artery, pulse intact and strong, and no other abnormality identified, no nodular density or mass, non tender. No erythema  Skin:    General: Skin is warm and dry.     Findings: No erythema or rash.  Neurological:     Mental Status: He is alert and oriented to person, place, and time.  Psychiatric:        Mood and Affect: Mood normal.        Behavior: Behavior normal.        Thought  Content: Thought content normal.     Comments: Well groomed, good eye contact, normal speech and thoughts   Results for orders placed or performed in visit on 12/11/19  PSA  Result Value Ref Range   PSA 0.4 < OR = 4.0 ng/mL  Lipid panel  Result Value Ref Range   Cholesterol 235 (H) <200 mg/dL   HDL 88 > OR = 40 mg/dL   Triglycerides 62 <150 mg/dL   LDL Cholesterol (Calc) 132 (H) mg/dL (calc)   Total CHOL/HDL Ratio 2.7 <5.0 (calc)   Non-HDL Cholesterol (Calc) 147 (H) <130 mg/dL (calc)  COMPLETE METABOLIC PANEL WITH GFR  Result Value Ref Range   Glucose, Bld 98 65 - 99 mg/dL   BUN 14 7 - 25 mg/dL   Creat 0.85 0.60 - 1.35 mg/dL   GFR, Est Non African American 103 > OR = 60 mL/min/1.40m2   GFR, Est African American 119 > OR = 60 mL/min/1.52m2   BUN/Creatinine Ratio NOT APPLICABLE 6 - 22 (calc)   Sodium 138 135 - 146 mmol/L   Potassium 4.4 3.5 - 5.3 mmol/L   Chloride 104 98 - 110 mmol/L   CO2 26 20 - 32 mmol/L   Calcium 9.2 8.6 - 10.3 mg/dL   Total Protein 6.8 6.1 - 8.1 g/dL   Albumin 4.6 3.6 - 5.1 g/dL   Globulin 2.2 1.9 - 3.7 g/dL (calc)   AG  Ratio 2.1 1.0 - 2.5 (calc)   Total Bilirubin 0.6 0.2 - 1.2 mg/dL   Alkaline phosphatase (APISO) 39 36 - 130 U/L   AST 33 10 - 40 U/L   ALT 38 9 - 46 U/L  CBC with Differential/Platelet  Result Value Ref Range   WBC 3.1 (L) 3.8 - 10.8 Thousand/uL   RBC 5.58 4.20 - 5.80 Million/uL   Hemoglobin 12.7 (L) 13.2 - 17.1 g/dL   HCT 42.2 38.5 - 50.0 %   MCV 75.6 (L) 80.0 - 100.0 fL   MCH 22.8 (L) 27.0 - 33.0 pg   MCHC 30.1 (L) 32.0 - 36.0 g/dL   RDW 15.3 (H) 11.0 - 15.0 %   Platelets 163 140 - 400 Thousand/uL   MPV 10.4 7.5 - 12.5 fL   Neutro Abs 1,274 (L) 1,500 - 7,800 cells/uL   Lymphs Abs 1,345 850 - 3,900 cells/uL   Absolute Monocytes 319 200 - 950 cells/uL   Eosinophils Absolute 130 15 - 500 cells/uL   Basophils Absolute 31 0 - 200 cells/uL   Neutrophils Relative % 41.1 %   Total Lymphocyte 43.4 %   Monocytes Relative 10.3 %   Eosinophils Relative 4.2 %   Basophils Relative 1.0 %  Hemoglobin A1c  Result Value Ref Range   Hgb A1c MFr Bld 5.7 (H) <5.7 % of total Hgb   Mean Plasma Glucose 117 (calc)   eAG (mmol/L) 6.5 (calc)      Assessment & Plan:   Problem List Items Addressed This Visit   None Visit Diagnoses     Swollen wrist, right    -  Primary       Localized palmar R wrist swelling radial aspect No other problem or complication identified Improved w/ rest and ice packs Recommend RICE therapy, wrist splint support Limit repetitive strain Some tingling can be related to carpal tunnel with nerve compression due to swelling in this area Unlikely to be blood clot or infection or mass or other concerning issue in this area  No orders of the defined types were  placed in this encounter.     Follow up plan: Return if symptoms worsen or fail to improve.   Nobie Putnam, [facility] 10/04/2020, 4:24 PM

## 2020-10-04 NOTE — Patient Instructions (Addendum)
Thank you for coming to the office today.  Likely a swelling reaction from repetitive use Try the wrist splint and compression if prefer  Numbness tingling can be from nerve impingement, like a carpal tunnel.  Follow up as needed  Please schedule a Follow-up Appointment to: Return if symptoms worsen or fail to improve.  If you have any other questions or concerns, please feel free to call the office or send a message through [place]. You may also schedule an earlier appointment if necessary.  Additionally, you may be receiving a survey about your experience at our office within a few days to 1 week by e-mail or mail. We value your feedback.  Nobie Putnam, DO [place]

## 2020-10-25 ENCOUNTER — Encounter: Payer: Self-pay | Admitting: Internal Medicine

## 2020-10-25 ENCOUNTER — Other Ambulatory Visit: Payer: Self-pay

## 2020-10-25 ENCOUNTER — Telehealth (INDEPENDENT_AMBULATORY_CARE_PROVIDER_SITE_OTHER): Payer: BC Managed Care – PPO | Admitting: Internal Medicine

## 2020-10-25 DIAGNOSIS — U071 COVID-19: Secondary | ICD-10-CM

## 2020-10-25 DIAGNOSIS — Z0289 Encounter for other administrative examinations: Secondary | ICD-10-CM | POA: Diagnosis not present

## 2020-10-25 NOTE — Progress Notes (Signed)
Virtual Visit via Video Note  I connected with Samuel Davis on 10/25/20 at  1:20 PM EDT by a video enabled telemedicine application and verified that I am speaking with the correct person using two identifiers.  Location: Patient: Home Provider: Office  Person's participating in this video call: Webb Silversmith, NP-C and Samuel Davis.   I discussed the limitations of evaluation and management by telemedicine and the availability of in person appointments. The patient expressed understanding and agreed to proceed.  History of Present Illness:  Pt needing FMLA form completion. He reports he tested positive for covid on 10/20/20.  He started having cold symptoms on 7/12.  He reports a positive COVID test on 7/15.  He reports his symptoms have mostly resolved at this time.  He does still have a slight postnasal drip.  He currently denies headache, runny nose, nasal congestion, ear pain, sore throat, cough or shortness of breath.  He denies fever, chills, body aches, nausea or vomiting.  He has had his COVID-vaccine exceeds.  He wears his mask when he is out and about.  He has not had known exposure that he is aware of.   Past Medical History:  Diagnosis Date   Wears dentures    partial upper    Current Outpatient Medications  Medication Sig Dispense Refill   diclofenac Sodium (VOLTAREN) 1 % GEL Apply 2 g topically 4 (four) times daily. 100 g 2   scopolamine (TRANSDERM-SCOP) 1 MG/3DAYS Place 1 patch (1.5 mg total) onto the skin every 3 (three) days. For motion sickness. Start 2 hr before onset symptoms, up to 12 hr before 4 patch 0   terbinafine (LAMISIL) 250 MG tablet Take 1 tablet (250 mg total) by mouth daily. 90 tablet 0   triamcinolone cream (KENALOG) 0.5 % Apply 1 application topically 2 (two) times daily. To affected areas, for up to 2 weeks. 30 g 1   No current facility-administered medications for this visit.    No Known Allergies  Family History  Problem Relation Age of Onset    Colon cancer Father 47       possibly earlier, but not diagnosed   Hypertension Sister     Social History   Socioeconomic History   Marital status: Single    Spouse name: Not on file   Number of children: Not on file   Years of education: College, [employer]   Highest education level: Associate degree: occupational, Hotel manager, or vocational program  Occupational History   Occupation: Distribution  Tobacco Use   Smoking status: Former    Packs/day: 0.75    Years: 10.00    Pack years: 7.50    Types: Cigarettes    Quit date: 09/08/2017    Years since quitting: 3.1   Smokeless tobacco: Former   Tobacco comments:    Quit cold [place]  Scientific laboratory technician Use: Never used  Substance and Sexual Activity   Alcohol use: Yes    Alcohol/week: 7.0 standard drinks    Types: 7 Cans of beer per week   Drug use: Never   Sexual activity: Not on file  Other Topics Concern   Not on file  Social History Narrative   Not on file   Social Determinants of Health   Financial Resource Strain: Not on file  Food Insecurity: Not on file  Transportation Needs: Not on file  Physical Activity: Not on file  Stress: Not on file  Social Connections: Not on file  Intimate Partner Violence: Not  on file     Constitutional: Denies fever, malaise, fatigue, headache or abrupt weight changes.  HEENT: Patient reports postnasal drip.  Denies eye pain, eye redness, ear pain, ringing in the ears, wax buildup, runny nose, nasal congestion, bloody nose, or sore throat. Respiratory: Denies difficulty breathing, shortness of breath, cough or sputum production.   Cardiovascular: Denies chest pain, chest tightness, palpitations or swelling in the hands or feet.  Gastrointestinal: Denies abdominal pain, bloating, constipation, diarrhea or blood in the stool.  GU: Denies urgency, frequency, pain with urination, burning sensation, blood in urine, odor or discharge. Musculoskeletal: Denies decrease in range of motion,  difficulty with gait, muscle pain or joint pain and swelling.  Skin: Denies redness, rashes, lesions or ulcercations.  Neurological: Denies dizziness, difficulty with memory, difficulty with speech or problems with balance and coordination.  Psych: Denies anxiety, depression, SI/HI.  No other specific complaints in a complete review of systems (except as listed in HPI above).    Observations/Objective: Wt Readings from Last 3 Encounters:  10/04/20 210 lb 3.2 oz (95.3 kg)  02/27/20 220 lb (99.8 kg)  12/19/19 218 lb (98.9 kg)    General: In NAD. HEENT:Nose: No congestion noted; Throat/Mouth: Slight hoarseness noted Pulmonary/Chest: Normal effort. No respiratory distress.  Neurological: Alert and oriented.   BMET    Component Value Date/Time   NA 138 12/19/2019 0958   K 4.4 12/19/2019 0958   CL 104 12/19/2019 0958   CO2 26 12/19/2019 0958   GLUCOSE 98 12/19/2019 0958   BUN 14 12/19/2019 0958   CREATININE 0.85 12/19/2019 0958   CALCIUM 9.2 12/19/2019 0958   GFRNONAA 103 12/19/2019 0958   GFRAA 119 12/19/2019 0958    Lipid Panel     Component Value Date/Time   CHOL 235 (H) 12/19/2019 0958   TRIG 62 12/19/2019 0958   HDL 88 12/19/2019 0958   CHOLHDL 2.7 12/19/2019 0958   LDLCALC 132 (H) 12/19/2019 0958    CBC    Component Value Date/Time   WBC 3.1 (L) 12/19/2019 0958   RBC 5.58 12/19/2019 0958   HGB 12.7 (L) 12/19/2019 0958   HCT 42.2 12/19/2019 0958   PLT 163 12/19/2019 0958   MCV 75.6 (L) 12/19/2019 0958   MCH 22.8 (L) 12/19/2019 0958   MCHC 30.1 (L) 12/19/2019 0958   RDW 15.3 (H) 12/19/2019 0958   LYMPHSABS 1,345 12/19/2019 0958   EOSABS 130 12/19/2019 0958   BASOSABS 31 12/19/2019 0958    Hgb A1C Lab Results  Component Value Date   HGBA1C 5.7 (H) 12/19/2019       Assessment and Plan:  Encounter for Form Completion, Covid 19:  He is too far out for antiviral therapy Some postnasal drip, may take Zyrtec and ibuprofen OTC as needed Encourage  salt water gargles for comfort FMLA forms completed, will scan copy into chart and fax originals  Return precautions discussed Follow Up Instructions:    I discussed the assessment and treatment plan with the patient. The patient was provided an opportunity to ask questions and all were answered. The patient agreed with the plan and demonstrated an understanding of the instructions.   The patient was advised to call back or seek an in-person evaluation if the symptoms worsen or if the condition fails to improve as anticipated.  Webb Silversmith, NP

## 2020-10-25 NOTE — Patient Instructions (Signed)
COVID-19: Quarantine and Isolation Quarantine If you were exposed Quarantine and stay away from others when you have been in close contact with someone whohas COVID-19. Isolate If you are sick or test positive Isolate when you are sick or when you have COVID-19, even if you don't have symptoms. When to stay home Calculating quarantine The date of your exposure is considered day 0. Day 1 is the first full day after your last contact with a person who has had COVID-19. Stay home and away from other people for at least 5 days. Learn why CDC updated guidance for the general public. IF YOU were exposed to COVID-19 and are NOT up-to-date IF YOU were exposed to COVID-19 and are NOT on COVID-19 vaccinations Quarantine for at least 5 days Stay home Stay home and quarantine for at least 5 full days. Wear a well-fitted mask if you must be around others in your home. Do not travel. Get tested Even if you don't develop symptoms, get tested at least 5 days after you last had close contact with someone with COVID-19. After quarantine Watch for symptoms Watch for symptoms until 10 days after you last had close contact with someone with COVID-19. Avoid travel It is best to avoid travel until a full 10 days after you last had close contact with someone with COVID-19. If you develop symptoms Isolate immediately and get tested. Continue to stay home until you know the results. Wear a well-fitted mask around others. Take precautions until day 10 Wear a mask Wear a well-fitted mask for 10 full days any time you are around others inside your home or in public. Do not go to places where you are unable to wear a mask. If you must travel during days 6-10, take precautions. Avoid being around people who are at high risk IF YOU were exposed to COVID-19 and are up-to-date IF YOU were exposed to COVID-19 and are on COVID-19 vaccinations No quarantine You do not need to stay home unless you develop  symptoms. Get tested Even if you don't develop symptoms, get tested at least 5 days after you last had close contact with someone with COVID-19. Watch for symptoms Watch for symptoms until 10 days after you last had close contact with someone with COVID-19. If you develop symptoms Isolate immediately and get tested. Continue to stay home until you know the results. Wear a well-fitted mask around others. Take precautions until day 10 Wear a mask Wear a well-fitted mask for 10 full days any time you are around others inside your home or in public. Do not go to places where you are unable to wear a mask. Take precautions if traveling Avoid being around people who are at high risk IF YOU were exposed to COVID-19 and had confirmed COVID-19 within the past 90 days (you tested positive using a viral test) No quarantine You do not need to stay home unless you develop symptoms. Watch for symptoms Watch for symptoms until 10 days after you last had close contact with someone with COVID-19. If you develop symptoms Isolate immediately and get tested. Continue to stay home until you know the results. Wear a well-fitted mask around others. Take precautions until day 10 Wear a mask Wear a well-fitted mask for 10 full days any time you are around others inside your home or in public. Do not go to places where you are unable to wear a mask. Take precautions if traveling Avoid being around people who are at high risk Calculating isolation  Day 0 is your first day of symptoms or a positive viral test. Day 1 is the first full day after your symptoms developed or your test specimen was collected. If you have COVID-19 or have symptoms, isolate for at least 5 days. IF YOU tested positive for COVID-19 or have symptoms, regardless of vaccination status Stay home for at least 5 days Stay home for 5 days and isolate from others in your home. Wear a well-fitted mask if you must be around others in your home. Do not  travel. Ending isolation if you had symptoms End isolation after 5 full days if you are fever-free for 24 hours (without the use of fever-reducing medication) and your symptoms are improving. Ending isolation if you did NOT have symptoms End isolation after at least 5 full days after your positive test. If you were severely ill with COVID-19 or are immunocompromised You should isolate for at least 10 days. Consult your doctor before ending isolation. Take precautions until day 10 Wear a mask Wear a well-fitted mask for 10 full days any time you are around others inside your home or in public. Do not go to places where you are unable to wear a mask. Do not travel Do not travel until a full 10 days after your symptoms started or the date your positive test was taken if you had no symptoms. Avoid being around people who are at high risk Definitions Exposure Contact with someone infected with SARS-CoV-2, the virus that causes COVID-19,in a way that increases the likelihood of getting infected with the virus. Close contact A close contact is someone who was less than 6 feet away from an infected person (laboratory-confirmed or a clinical diagnosis) for a cumulative total of 15 minutes or more over a 24-hour period. For example, three individual 5-minute exposures for a total of 15 minutes. People who are exposed to someone with COVID-19 after they completed at least 5 days of isolation are notconsidered close contacts. Julio Sicks is a strategy used to prevent transmission of COVID-19 by keeping people who have been in close contact with someone with COVID-19 apart from others. Who does not need to quarantine? If you had close contact with someone with COVID-19 and you are in one of the following groups, you do not need to quarantine. You are up to date with your COVID-19 vaccines. You had confirmed COVID-19 within the last 90 days (meaning you tested positive using a viral test). You  should wear a well-fitting mask around others for 10 days from the date of your last close contact with someone with COVID-19 (the date of last close contact is considered day 0). Get tested at least 5 days after you last had close contact with someone with COVID-19. If you test positive or develop COVID-19 symptoms, isolate from other people and follow recommendations in the Isolation section below. If you tested positive for COVID-19 with a viral test within the previous 90 days and subsequently recovered and remain without COVID-19 symptoms, you do not need to quarantine or get tested after close contact. You should wear a well-fitting mask around others for 10 days from the date of your last close contact withsomeone with COVID-19 (the date of last close contact is considered day 0). Who should quarantine? If you come into close contact with someone with COVID-19, you should quarantine if you are not up to date on COVID-19 vaccines. This includes people who are not vaccinated. What to do for quarantine Stay home and away  from other people for at least 5 days (day 0 through day 5) after your last contact with a person who has COVID-19. The date of your exposure is considered day 0. Wear a well-fitting mask when around others at home, if possible. For 10 days after your last close contact with someone with COVID-19, watch for fever (100.66F or greater), cough, shortness of breath, or other COVID-19 symptoms. If you develop symptoms, get tested immediately and isolate until you receive your test results. If you test positive, follow isolation recommendations. If you do not develop symptoms, get tested at least 5 days after you last had close contact with someone with COVID-19. If you test negative, you can leave your home, but continue to wear a well-fitting mask when around others at home and in public until 10 days after your last close contact with someone with COVID-19. If you test positive, you should  isolate for at least 5 days from the date of your positive test (if you do not have symptoms). If you do develop COVID-19 symptoms, isolate for at least 5 days from the date your symptoms began (the date the symptoms started is day 0). Follow recommendations in the isolation section below. If you are unable to get a test 5 days after last close contact with someone with COVID-19, you can leave your home after day 5 if you have been without COVID-19 symptoms throughout the 5-day period. Wear a well-fitting mask for 10 days after your date of last close contact when around others at home and in public. Avoid people who are immunocompromised or at high risk for severe disease, and nursing homes and other high-risk settings, until after at least 10 days. If possible, stay away from people you live with, especially people who are at higher risk for getting very sick from COVID-19, as well as others outside your home throughout the full 10 days after your last close contact with someone with COVID-19. If you are unable to quarantine, you should wear a well-fitting mask for 10 days when around others at home and in public. If you are unable to wear a mask when around others, you should continue to quarantine for 10 days. Avoid people who are immunocompromised or at high risk for severe disease, and nursing homes and other high-risk settings, until after at least 10 days. See additional information about travel. Do not go to places where you are unable to wear a mask, such as restaurants and some gyms, and avoid eating around others at home and at work until after 10 days after your last close contact with someone with COVID-19. After quarantine Watch for symptoms until 10 days after your last close contact with someone with COVID-19. If you have symptoms, isolate immediately and get tested. Quarantine in high-risk congregate settings In certain congregate settings that have high risk of secondary transmission  (such as Systems analyst and detention facilities, homeless shelters, or cruise ships), CDC recommends a 10-day quarantine for residents, regardless of vaccination and booster status. During periods of critical staffing shortages, facilities may consider shortening the quarantine period for staff to ensure continuity of operations. Decisions to shorten quarantine in these settings should be made in consultation with state, local, tribal, or territorial health departments and should take into consideration the context and characteristics of the facility. CDC's setting-specific guidance provides additional recommendations for these settings. Isolation Isolation is used to separate people with confirmed or suspected COVID-19 from those without COVID-19. People who are in isolation should stay  home until it's safe for them to be around others. At home, anyone sick or infected should separate from others, or wear a well-fitting mask when they need to be around others. People in isolation should stay in a specific "sick room" or area and use a separate bathroom if available. Everyone who has presumed or confirmed COVID-19 should stay home and isolate from other people for at least 5 full days (day 0 is the first day of symptoms or the date of the day of the positive viral test for asymptomatic persons). They should wear a mask when around others at home and in public for an additional 5 days. People who are confirmed to have COVID-19 or are showing symptoms of COVID-19 need to isolate regardless of their vaccination status. This includes: People who have a positive viral test for COVID-19, regardless of whether or not they have symptoms. People with symptoms of COVID-19, including people who are awaiting test results or have not been tested. People with symptoms should isolate even if they do not know if they have been in close contact with someone with COVID-19. What to do for isolation Monitor your symptoms. If you  have an emergency warning sign (including trouble breathing), seek emergency medical care immediately. Stay in a separate room from other household members, if possible. Use a separate bathroom, if possible. Take steps to improve ventilation at home, if possible. Avoid contact with other members of the household and pets. Don't share personal household items, like cups, towels, and utensils. Wear a well-fitting mask when you need to be around other people. Learn more about what to do if you are sick and how to notify your contacts. Ending isolation for people who had COVID-19 and had symptoms If you had COVID-19 and had symptoms, isolate for at least 5 days. To calculate your 5-day isolation period, day 0 is your first day of symptoms. Day 1 is the first full day after your symptoms developed. You can leave isolation after 5 full days. You can end isolation after 5 full days if you are fever-free for 24 hours without the use of fever-reducing medication and your other symptoms have improved (Loss of taste and smell may persist for weeks or months after recovery and need not delay the end of isolation). You should continue to wear a well-fitting mask around others at home and in public for 5 additional days (day 6 through day 10) after the end of your 5-day isolation period. If you are unable to wear a mask when around others, you should continue to isolate for a full 10 days. Avoid people who are immunocompromised or at high risk for severe disease, and nursing homes and other high-risk settings, until after at least 10 days. If you continue to have fever or your other symptoms have not improved after 5 days of isolation, you should wait to end your isolation until you are fever-free for 24 hours without the use of fever-reducing medication and your other symptoms have improved. Continue to wear a well-fitting mask. Contact your healthcare provider if you have questions. See additional information about  travel. Do not go to places where you are unable to wear a mask, such as restaurants and some gyms, and avoid eating around others at home and at work until a full 10 days after your first day of symptoms. If an individual has access to a test and wants to test, the best approach is to use an antigen test1 towards the end of  the 5-day isolation period. Collect the test sample only if you are fever-free for 24 hours without the use of fever-reducing medication and your other symptoms have improved (loss of taste and smell may persist for weeks or months after recovery and need not delay the end of isolation). If your test result is positive, you should continue to isolate until day 10. If your test result is negative, you can end isolation, but continue to wear a well-fitting mask around others at home and in public until day 10. Follow additional recommendations for masking and avoiding travel as described above. 1As noted in the labeling for authorized over-the counter antigen tests: Negative results should be treated as presumptive. Negative results do not rule out SARS-CoV-2 infection and should not be used as the sole basis for treatment or patient management decisions, including infection control decisions. To improve results, antigen tests should be used twice over a three-day period with at least 24 hours and no more than 48 hours between tests. Note that these recommendations on ending isolation do not apply to people with moderate or severe COVID-19 or with weakened immune systems (immunocompromised). See section below for recommendations for when toend isolation for these groups. Ending isolation for people who tested positive for COVID-19 but had no symptoms If you test positive for COVID-19 and never develop symptoms, isolate for at least 5 days. Day 0 is the day of your positive viral test (based on the date you were tested) and day 1 is the first full day after the specimen was collected for your  positive test. You can leave isolation after 5 full days. If you continue to have no symptoms, you can end isolation after at least 5 days. You should continue to wear a well-fitting mask around others at home and in public until day 10 (day 6 through day 10). If you are unable to wear a mask when around others, you should continue to isolate for 10 days. Avoid people who are immunocompromised or at high risk for severe disease, and nursing homes and other high-risk settings, until after at least 10 days. If you develop symptoms after testing positive, your 5-day isolation period should start over. Day 0 is your first day of symptoms. Follow the recommendations above for ending isolation for people who had COVID-19 and had symptoms. See additional information about travel. Do not go to places where you are unable to wear a mask, such as restaurants and some gyms, and avoid eating around others at home and at work until 10 days after the day of your positive test. If an individual has access to a test and wants to test, the best approach is to use an antigen test1 towards the end of the 5-day isolation period. If your test result is positive, you should continue to isolate until day 10. If your test result is negative, you can end isolation, but continue to wear a well-fitting mask around others at home and in public until day 10. Follow additionalrecommendations for masking and avoiding travel as described above. 1As noted in the labeling for authorized over-the counter antigen tests: Negative results should be treated as presumptive. Negative results do not rule out SARS-CoV-2 infection and should not be used as the sole basis for treatment or patient management decisions, including infection control decisions. To improve results, antigen tests should be used twice over a three-day period with at least 24 hours and no more than 48 hours between tests. Ending isolation for people who were  severely ill with  COVID-19 or have a weakened immune system (immunocompromised) People who are severely ill with COVID-19 (including those who were hospitalized or required intensive care or ventilation support) and people with compromised immune systems might need to isolate at home longer. They may also require testing with a viral test to determine when they can be around others. CDC recommends an isolation period of at least 10 and up to 20 days for people who were severely ill with COVID-19 and for people with weakened immune systems. Consult with your healthcare provider about when you can resume being aroundother people. People who are immunocompromised should talk to their healthcare provider about the potential for reduced immune responses to COVID-19 vaccines and the need to continue to follow current prevention measures (including wearing a well-fitting mask, staying 6 feet apart from others they don't live with, and avoiding crowds and poorly ventilated indoor spaces) to protect themselves against COVID-19 until advised otherwise by their healthcare provider. Close contacts of immunocompromised people--including household members--should also be encouraged to receive all recommended COVID-19 vaccine doses to help protect these people. Isolation in high-risk congregate settings In certain high-risk congregate settings that have high risk of secondary transmission and where it is not feasible to cohort people (such as Systems analyst and detention facilities, homeless shelters, and cruise ships), CDC recommends a 10-day isolation period for residents. During periods of critical staffing shortages, facilities may consider shortening the isolation period for staff to ensure continuity of operations. Decisions to shorten isolation in these settings should be made in consultation with state, local, tribal, or territorial health departments and should take into consideration the context and characteristics of the facility.  CDC's setting-specific guidance provides additional recommendations for these settings. This CDC guidance is meant to supplement--not replace--any federal, state, local,territorial, or tribal health and safety laws, rules, and regulations. Recommendations for specific settings These recommendations do not apply to healthcare professionals. For guidance specific to these settings, see Healthcare professionals: Interim Guidance for Optician, dispensing with SARS-CoV-2 Infection or Exposure to SARS-CoV-2 Patients, residents, and visitors to healthcare settings: Interim Infection Prevention and Control Recommendations for Healthcare Personnel During the [place] 2019 (COVID-19) Pandemic Additional setting-specific guidance and recommendations are available. These recommendations on quarantine and isolation do apply to [facility] settings. Additional guidance is available here: Overview of COVID-19 Quarantine for K-12 Schools Travelers: Travel information and recommendations Congregate facilities and other settings: [employer] for community, work, and school settings Ongoing COVID-19 exposure FAQs I live with someone with COVID-19, but I cannot be separated from them. How do we manage quarantine in this situation? It is very important for people with COVID-19 to remain apart from other people, if possible, even if they are living together. If separation of the person with COVID-19 from others that they live with is not possible, the other people that they live with will have ongoing exposure, meaning they will be repeatedly exposed until that person is no longer able to spread the virus to other people. In this situation, there are precautions you can take to limit the spread of COVID-19: The person with COVID-19 and everyone they live with should wear a well-fitting mask inside the home. If possible, one person should care for the person with COVID-19 to limit the number of people  who are in close contact with the infected person. Take steps to protect yourself and others to reduce transmission in the home: Quarantine if you are not up to date with your COVID-19  vaccines. Isolate if you are sick or tested positive for COVID-19, even if you don't have symptoms. Learn more about the public health recommendations for testing, mask use and quarantine of close contacts, like yourself, who have ongoing exposure. These recommendations differ depending on your vaccination status. What should I do if I have ongoing exposure to COVID-19 from someone I live with? Recommendations for this situation depend on your vaccination status: If you are not up to date on COVID-19 vaccines and have ongoing exposure to COVID-19, you should: Begin quarantine immediately and continue to quarantine throughout the isolation period of the person with COVID-19. Continue to quarantine for an additional 5 days starting the day after the end of isolation for the person with COVID-19. Get tested at least 5 days after the end of isolation of the infected person that lives with them. If you test negative, you can leave the home but should continue to wear a well-fitting mask when around others at home and in public until 10 days after the end of isolation for the person with COVID-19. Isolate immediately if you develop symptoms of COVID-19 or test positive. If you are up to date with COVID-19 vaccines and have ongoing exposure to COVID-19, you should: Get tested at least 5 days after your first exposure. A person with COVID-19 is considered infectious starting 2 days before they develop symptoms, or 2 days before the date of their positive test if they do not have symptoms. Get tested again at least 5 days after the end of isolation for the person with COVID-19. Wear a well-fitting mask when you are around the person with COVID-19, and do this throughout their isolation period. Wear a well-fitting mask around  others for 10 days after the infected person's isolation period ends. Isolate immediately if you develop symptoms of COVID-19 or test positive. What should I do if multiple people I live with test positive for COVID-19 at different times? Recommendations for this situation depend on your vaccination status: If you are not up to date with your COVID-19 vaccines, you should: Quarantine throughout the isolation period of any infected person that you live with. Continue to quarantine until 5 days after the end of isolation date for the most recently infected person that lives with you. For example, if the last day of isolation of the person most recently infected with COVID-19 was June 30, the new 5-day quarantine period starts on July 1. Get tested at least 5 days after the end of isolation for the most recently infected person that lives with you. Wear a well-fitting mask when you are around any person with COVID-19 while that person is in isolation. Wear a well-fitting mask when you are around other people until 10 days after your last close contact. Isolate immediately if you develop symptoms of COVID-19 or test positive. If you are up to date with COVID-19 your vaccines , you should: Get tested at least 5 days after your first exposure. A person with COVID-19 is considered infectious starting 2 days before they developed symptoms, or 2 days before the date of their positive test if they do not have symptoms. Get tested again at least 5 days after the end of isolation for the most recently infected person that lives with you. Wear a well-fitting mask when you are around any person with COVID-19 while that person is in isolation. Wear a well-fitting mask around others for 10 days after the end of isolation for the most recently infected person that  lives with you. For example, if the last day of isolation for the person most recently infected with COVID-19 was June 30, the new 10-day period to wear a  well-fitting mask indoors in public starts on July 1. Isolate immediately if you develop symptoms of COVID-19 or test positive. I had COVID-19 and completed isolation. Do I have to quarantine or get tested if someone I live with gets COVID-19 shortly after I completed isolation? No. If you recently completed isolation and someone that lives with you tests positive for the virus that causes COVID-19 shortly after the end of your isolation period, you do not have to quarantine or get tested as long as you do not develop new symptoms. Once all of the people that live together have completed isolation or quarantine, refer to the guidance below for new exposures to COVID-19. If you had COVID-19 in the previous 90 days and then came into close contact with someone with COVID-19, you do not have to quarantine or get tested if you do not have symptoms. But you should: Wear a well-fitting mask indoors in public for 10 days after exposure. Monitor for COVID-19 symptoms and isolate immediately if symptoms develop. Consult with a healthcare provider for testing recommendations if new symptoms develop. If more than 90 days have passed since your recovery from infection, follow CDC's recommendations for close contacts. These recommendations will differ depending on your vaccination status. 05/06/2020 Content source: [facility] ([facility]), Division of Viral Diseases This information is not intended to replace advice given to you by your health care provider. Make sure you discuss any questions you have with your healthcare provider. Document Revised: 05/14/2020 Document Reviewed: 05/14/2020 Elsevier Patient Education  [place].

## 2020-12-24 ENCOUNTER — Encounter: Payer: BC Managed Care – PPO | Admitting: Family Medicine

## 2020-12-30 ENCOUNTER — Ambulatory Visit: Payer: Self-pay

## 2020-12-30 DIAGNOSIS — M25511 Pain in right shoulder: Secondary | ICD-10-CM | POA: Diagnosis not present

## 2020-12-30 DIAGNOSIS — M13811 Other specified arthritis, right shoulder: Secondary | ICD-10-CM | POA: Diagnosis not present

## 2020-12-30 DIAGNOSIS — M7591 Shoulder lesion, unspecified, right shoulder: Secondary | ICD-10-CM | POA: Diagnosis not present

## 2020-12-30 DIAGNOSIS — M778 Other enthesopathies, not elsewhere classified: Secondary | ICD-10-CM | POA: Insufficient documentation

## 2020-12-30 NOTE — Telephone Encounter (Signed)
Patient is aware of recommendations that Dr. Raliegh Ip has made.

## 2020-12-30 NOTE — Telephone Encounter (Signed)
I don't see any availability either on my schedule or Webb Silversmith, FNP   I would recommend  walk in Ortho Urgent Care at:  [facility] (formerly [facility] Address: [address], [place], [place] [address] Hours:  9AM-5PM Phone: [phone] OR Phone: [phone]  Urgent Care Hours Monday through Sunday (weekends are open as well) 9am to 9pm for walk in Orthopedic Urgent Care.  Nobie Putnam, DO [facility] 12/30/2020, 11:22 AM

## 2020-12-30 NOTE — Telephone Encounter (Signed)
Pt. Started having right shoulder pain 2 weeks ago. Pain is 8/10. Requests to be seen this week. No availability this week. Would like to be worked in if possible because he is off work. Please advise.    Reason for Disposition  [1] MODERATE pain (e.g., interferes with normal activities) AND [2] present > 3 days  Answer Assessment - Initial Assessment Questions 1. ONSET: "When did the pain start?"     2 weeks ago 2. LOCATION: "Where is the pain located?"     Right  3. PAIN: "How bad is the pain?" (Scale 1-10; or mild, moderate, severe)   - MILD (1-3): doesn't interfere with normal activities   - MODERATE (4-7): interferes with normal activities (e.g., work or school) or awakens from sleep   - SEVERE (8-10): excruciating pain, unable to do any normal activities, unable to move arm at all due to pain     8 4. WORK OR EXERCISE: "Has there been any recent work or exercise that involved this part of the body?"     No 5. CAUSE: "What do you think is causing the shoulder pain?"     Unsure 6. OTHER SYMPTOMS: "Do you have any other symptoms?" (e.g., neck pain, swelling, rash, fever, numbness, weakness)     No 7. PREGNANCY: "Is there any chance you are pregnant?" "When was your last menstrual period?"     N/a  Protocols used: Shoulder Pain-A-AH

## 2021-01-27 ENCOUNTER — Other Ambulatory Visit: Payer: Self-pay

## 2021-01-27 ENCOUNTER — Ambulatory Visit (INDEPENDENT_AMBULATORY_CARE_PROVIDER_SITE_OTHER): Payer: BC Managed Care – PPO | Admitting: Family Medicine

## 2021-01-27 ENCOUNTER — Encounter: Payer: Self-pay | Admitting: Family Medicine

## 2021-01-27 VITALS — BP 122/70 | HR 69 | Ht 74.0 in | Wt 218.6 lb

## 2021-01-27 DIAGNOSIS — E7800 Pure hypercholesterolemia, unspecified: Secondary | ICD-10-CM

## 2021-01-27 DIAGNOSIS — Z0000 Encounter for general adult medical examination without abnormal findings: Secondary | ICD-10-CM | POA: Diagnosis not present

## 2021-01-27 DIAGNOSIS — D509 Iron deficiency anemia, unspecified: Secondary | ICD-10-CM | POA: Diagnosis not present

## 2021-01-27 DIAGNOSIS — Z1159 Encounter for screening for other viral diseases: Secondary | ICD-10-CM

## 2021-01-27 DIAGNOSIS — R7309 Other abnormal glucose: Secondary | ICD-10-CM

## 2021-01-27 DIAGNOSIS — Z125 Encounter for screening for malignant neoplasm of prostate: Secondary | ICD-10-CM

## 2021-01-27 NOTE — Progress Notes (Signed)
Subjective:    Patient ID: Samuel Davis, male    DOB: 1970/11/03, 50 y.o.   MRN: [id number]  Samuel Davis is a 50 y.o. male presenting on 01/27/2021 for Annual Exam   HPI  Here for Annual Physical and Fasting Lab Due  Lifestyle Diet: Tries to eat balanced, fruit / vegetable daily - drinks only water and some juice, not drinking soda Protein with exercise Exercise: regular exercise, less compared to previous He is sleeping well, no concerns.   Hx Elevated BP No outside readings Past history of mild elevated BP on electronic cuff, repeat manual improved  HYPERLIPIDEMIA: - Reports no concerns. Last lipid panel 2021, had improved LDL cholesterol - Not on any cholesterol medication Due for lab today   Chronic Anemia, microcytic / Low WBC Prior results compared with stable slightly declined Hgb and WBC, previous history dating back to >20 yr ago with similar results. He is unaware of other family member with similar issues.   Health Maintenance:  Updated Flu Shot already Completed COVID Booster last week Due for routine Hep C Screening  Colonoscopy last done 2021, next due 10 yr 2031  Due for PSA last year 0.4 negative.  Depression screen [facility] 2/9 01/27/2021 10/04/2020 12/19/2019  Decreased Interest 0 0 0  Down, Depressed, Hopeless 0 0 0  PHQ - 2 Score 0 0 0  Altered sleeping 0 0 -  Tired, decreased energy 0 0 -  Change in appetite 0 0 -  Feeling bad or failure about yourself  0 0 -  Trouble concentrating 0 0 -  Moving slowly or fidgety/restless 0 0 -  Suicidal thoughts 0 0 -  PHQ-9 Score 0 0 -  Difficult doing work/chores Not difficult at all Not difficult at all -    Past Medical History:  Diagnosis Date   Wears dentures    partial upper   Past Surgical History:  Procedure Laterality Date   COLONOSCOPY WITH PROPOFOL N/A 06/13/2019   Procedure: COLONOSCOPY WITH PROPOFOL;  Surgeon: Lin Landsman, MD;  Location: [facility];  Service: Endoscopy;   Laterality: N/A;  Priority 4   THUMB ARTHROSCOPY  05/08/2018   Fracture repair.  [facility]   Social History   Socioeconomic History   Marital status: Single    Spouse name: Not on file   Number of children: Not on file   Years of education: College, [employer]   Highest education level: Associate degree: occupational, Hotel manager, or vocational program  Occupational History   Occupation: Distribution  Tobacco Use   Smoking status: Former    Packs/day: 0.75    Years: 10.00    Pack years: 7.50    Types: Cigarettes    Quit date: 09/08/2017    Years since quitting: 3.3   Smokeless tobacco: Former   Tobacco comments:    Quit cold [place]  Scientific laboratory technician Use: Never used  Substance and Sexual Activity   Alcohol use: Not Currently    Alcohol/week: 7.0 standard drinks    Types: 7 Cans of beer per week   Drug use: Never   Sexual activity: Not on file  Other Topics Concern   Not on file  Social History Narrative   Not on file   Social Determinants of Health   Financial Resource Strain: Not on file  Food Insecurity: Not on file  Transportation Needs: Not on file  Physical Activity: Not on file  Stress: Not on file  Social Connections: Not on file  Intimate Partner Violence: Not on file   Family History  Problem Relation Age of Onset   Colon cancer Father 55       possibly earlier, but not diagnosed   Hypertension Sister    Current Outpatient Medications on File Prior to Visit  Medication Sig   diclofenac Sodium (VOLTAREN) 1 % GEL Apply 2 g topically 4 (four) times daily.   No current facility-administered medications on file prior to visit.    Review of Systems  Constitutional:  Negative for activity change, appetite change, chills, diaphoresis, fatigue and fever.  HENT:  Negative for congestion and hearing loss.   Eyes:  Negative for visual disturbance.  Respiratory:  Negative for cough, chest tightness, shortness of breath and wheezing.   Cardiovascular:  Negative  for chest pain, palpitations and leg swelling.  Gastrointestinal:  Negative for abdominal pain, constipation, diarrhea, nausea and vomiting.  Genitourinary:  Negative for dysuria, frequency and hematuria.  Musculoskeletal:  Negative for arthralgias and neck pain.  Skin:  Negative for rash.  Allergic/Immunologic: Negative for environmental allergies.  Neurological:  Negative for dizziness, weakness, light-headedness, numbness and headaches.  Hematological:  Negative for adenopathy.  Psychiatric/Behavioral:  Negative for behavioral problems, dysphoric mood and sleep disturbance.   Per HPI unless specifically indicated above      Objective:    BP 122/70 (BP Location: Left Arm, Patient Position: Sitting, Cuff Size: Large)   Pulse 69   Ht 6\' 2"  (1.88 m)   Wt 218 lb 9.6 oz (99.2 kg)   SpO2 98%   BMI 28.07 kg/m   Wt Readings from Last 3 Encounters:  01/27/21 218 lb 9.6 oz (99.2 kg)  10/04/20 210 lb 3.2 oz (95.3 kg)  02/27/20 220 lb (99.8 kg)    Physical Exam Vitals and nursing note reviewed.  Constitutional:      General: He is not in acute distress.    Appearance: He is well-developed. He is not diaphoretic.     Comments: Well-appearing, comfortable, cooperative  HENT:     Head: Normocephalic and atraumatic.  Eyes:     General:        Right eye: No discharge.        Left eye: No discharge.     Conjunctiva/sclera: Conjunctivae normal.     Pupils: Pupils are equal, round, and reactive to light.  Neck:     Thyroid: No thyromegaly.     Vascular: No carotid bruit.  Cardiovascular:     Rate and Rhythm: Normal rate and regular rhythm.     Pulses: Normal pulses.     Heart sounds: Normal heart sounds. No murmur heard. Pulmonary:     Effort: Pulmonary effort is normal. No respiratory distress.     Breath sounds: Normal breath sounds. No wheezing or rales.  Abdominal:     General: Bowel sounds are normal. There is no distension.     Palpations: Abdomen is soft. There is no mass.      Tenderness: There is no abdominal tenderness.  Musculoskeletal:        General: No tenderness. Normal range of motion.     Cervical back: Normal range of motion and neck supple.     Right lower leg: No edema.     Left lower leg: No edema.     Comments: Upper / Lower Extremities: - Normal muscle tone, strength bilateral upper extremities 5/5, lower extremities 5/5  Lymphadenopathy:     Cervical: No cervical adenopathy.  Skin:    General:  Skin is warm and dry.     Findings: No erythema or rash.  Neurological:     Mental Status: He is alert and oriented to person, place, and time.     Comments: Distal sensation intact to light touch all extremities  Psychiatric:        Mood and Affect: Mood normal.        Behavior: Behavior normal.        Thought Content: Thought content normal.     Comments: Well groomed, good eye contact, normal speech and thoughts      Results for orders placed or performed in visit on 12/11/19  PSA  Result Value Ref Range   PSA 0.4 < OR = 4.0 ng/mL  Lipid panel  Result Value Ref Range   Cholesterol 235 (H) <200 mg/dL   HDL 88 > OR = 40 mg/dL   Triglycerides 62 <150 mg/dL   LDL Cholesterol (Calc) 132 (H) mg/dL (calc)   Total CHOL/HDL Ratio 2.7 <5.0 (calc)   Non-HDL Cholesterol (Calc) 147 (H) <130 mg/dL (calc)  COMPLETE METABOLIC PANEL WITH GFR  Result Value Ref Range   Glucose, Bld 98 65 - 99 mg/dL   BUN 14 7 - 25 mg/dL   Creat 0.85 0.60 - 1.35 mg/dL   GFR, Est Non African American 103 > OR = 60 mL/min/1.63m2   GFR, Est African American 119 > OR = 60 mL/min/1.43m2   BUN/Creatinine Ratio NOT APPLICABLE 6 - 22 (calc)   Sodium 138 135 - 146 mmol/L   Potassium 4.4 3.5 - 5.3 mmol/L   Chloride 104 98 - 110 mmol/L   CO2 26 20 - 32 mmol/L   Calcium 9.2 8.6 - 10.3 mg/dL   Total Protein 6.8 6.1 - 8.1 g/dL   Albumin 4.6 3.6 - 5.1 g/dL   Globulin 2.2 1.9 - 3.7 g/dL (calc)   AG Ratio 2.1 1.0 - 2.5 (calc)   Total Bilirubin 0.6 0.2 - 1.2 mg/dL   Alkaline  phosphatase (APISO) 39 36 - 130 U/L   AST 33 10 - 40 U/L   ALT 38 9 - 46 U/L  CBC with Differential/Platelet  Result Value Ref Range   WBC 3.1 (L) 3.8 - 10.8 Thousand/uL   RBC 5.58 4.20 - 5.80 Million/uL   Hemoglobin 12.7 (L) 13.2 - 17.1 g/dL   HCT 42.2 38.5 - 50.0 %   MCV 75.6 (L) 80.0 - 100.0 fL   MCH 22.8 (L) 27.0 - 33.0 pg   MCHC 30.1 (L) 32.0 - 36.0 g/dL   RDW 15.3 (H) 11.0 - 15.0 %   Platelets 163 140 - 400 Thousand/uL   MPV 10.4 7.5 - 12.5 fL   Neutro Abs 1,274 (L) 1,500 - 7,800 cells/uL   Lymphs Abs 1,345 850 - 3,900 cells/uL   Absolute Monocytes 319 200 - 950 cells/uL   Eosinophils Absolute 130 15 - 500 cells/uL   Basophils Absolute 31 0 - 200 cells/uL   Neutrophils Relative % 41.1 %   Total Lymphocyte 43.4 %   Monocytes Relative 10.3 %   Eosinophils Relative 4.2 %   Basophils Relative 1.0 %  Hemoglobin A1c  Result Value Ref Range   Hgb A1c MFr Bld 5.7 (H) <5.7 % of total Hgb   Mean Plasma Glucose 117 (calc)   eAG (mmol/L) 6.5 (calc)      Assessment & Plan:   Problem List Items Addressed This Visit     Microcytic anemia   Relevant Orders   CBC  with Differential/Platelet   Elevated LDL cholesterol level    Due for lipid panel today Check TSH Encourage lifestyle modification diet No indication for statin      Relevant Orders   COMPLETE METABOLIC PANEL WITH GFR   Lipid panel   TSH   Other Visit Diagnoses     Annual physical exam    -  Primary   Relevant Orders   COMPLETE METABOLIC PANEL WITH GFR   Lipid panel   CBC with Differential/Platelet   Hemoglobin A1c   PSA   TSH   Need for hepatitis C screening test       Relevant Orders   Hepatitis C antibody   Abnormal glucose       Relevant Orders   Hemoglobin A1c   Screening for prostate cancer       Relevant Orders   PSA       Updated Health Maintenance information Updated vaccines Fasting labs ordered today f/u results Encouraged improvement to lifestyle with diet and exercise Goal of  weight loss  Mild elevated BP again on initial electronic cuff first came into office, manual repeat at end of visit normalized, similar problem in past. Unlikely to be dx HTN at this time. Monitor outside reading.  No orders of the defined types were placed in this encounter.     Follow up plan: Return in about 1 year (around 01/27/2022) for 1 year Annual Physical fasting lab AFTER.  Nobie Putnam, [facility] 01/27/2021, 8:31 AM

## 2021-01-27 NOTE — Assessment & Plan Note (Signed)
Due for lipid panel today Check TSH Encourage lifestyle modification diet No indication for statin

## 2021-01-27 NOTE — Patient Instructions (Addendum)
Thank you for coming to the office today. Stay tuned for lab results BP much improved on manual re-check  Please schedule a Follow-up Appointment to: Return in about 1 year (around 01/27/2022) for 1 year Annual Physical fasting lab AFTER.  If you have any other questions or concerns, please feel free to call the office or send a message through [place]. You may also schedule an earlier appointment if necessary.  Additionally, you may be receiving a survey about your experience at our office within a few days to 1 week by e-mail or mail. We value your feedback.  Nobie Putnam, DO [place]

## 2021-01-28 ENCOUNTER — Other Ambulatory Visit: Payer: Self-pay | Admitting: Family Medicine

## 2021-01-28 DIAGNOSIS — R7989 Other specified abnormal findings of blood chemistry: Secondary | ICD-10-CM

## 2021-01-28 LAB — COMPLETE METABOLIC PANEL WITH GFR
AG Ratio: 1.9 (calc) (ref 1.0–2.5)
ALT: 37 U/L (ref 9–46)
AST: 31 U/L (ref 10–40)
Albumin: 4.3 g/dL (ref 3.6–5.1)
Alkaline phosphatase (APISO): 38 U/L (ref 36–130)
BUN/Creatinine Ratio: 14 (calc) (ref 6–22)
BUN: 19 mg/dL (ref 7–25)
CO2: 27 mmol/L (ref 20–32)
Calcium: 9.3 mg/dL (ref 8.6–10.3)
Chloride: 106 mmol/L (ref 98–110)
Creat: 1.33 mg/dL — ABNORMAL HIGH (ref 0.60–1.29)
Globulin: 2.3 g/dL (calc) (ref 1.9–3.7)
Glucose, Bld: 108 mg/dL (ref 65–139)
Potassium: 4.5 mmol/L (ref 3.5–5.3)
Sodium: 141 mmol/L (ref 135–146)
Total Bilirubin: 0.5 mg/dL (ref 0.2–1.2)
Total Protein: 6.6 g/dL (ref 6.1–8.1)
eGFR: 66 mL/min/{1.73_m2} (ref >=60)

## 2021-01-28 LAB — CBC WITH DIFFERENTIAL/PLATELET
Absolute Monocytes: 371 cells/uL (ref 200–950)
Basophils Absolute: 39 cells/uL (ref 0–200)
Basophils Relative: 1 %
Eosinophils Absolute: 191 cells/uL (ref 15–500)
Eosinophils Relative: 4.9 %
HCT: 42.4 % (ref 38.5–50.0)
Hemoglobin: 12.9 g/dL — ABNORMAL LOW (ref 13.2–17.1)
Lymphs Abs: 1135 cells/uL (ref 850–3900)
MCH: 23 pg — ABNORMAL LOW (ref 27.0–33.0)
MCHC: 30.4 g/dL — ABNORMAL LOW (ref 32.0–36.0)
MCV: 75.7 fL — ABNORMAL LOW (ref 80.0–100.0)
MPV: 11.2 fL (ref 7.5–12.5)
Monocytes Relative: 9.5 %
Neutro Abs: 2165 cells/uL (ref 1500–7800)
Neutrophils Relative %: 55.5 %
Platelets: 180 10*3/uL (ref 140–400)
RBC: 5.6 10*6/uL (ref 4.20–5.80)
RDW: 14.9 % (ref 11.0–15.0)
Total Lymphocyte: 29.1 %
WBC: 3.9 10*3/uL (ref 3.8–10.8)

## 2021-01-28 LAB — LIPID PANEL
Cholesterol: 223 mg/dL — ABNORMAL HIGH (ref <200)
HDL: 85 mg/dL (ref >=40)
LDL Cholesterol (Calc): 123 mg/dL (calc) — ABNORMAL HIGH
Non-HDL Cholesterol (Calc): 138 mg/dL (calc) — ABNORMAL HIGH (ref <130)
Total CHOL/HDL Ratio: 2.6 (calc) (ref <5.0)
Triglycerides: 63 mg/dL (ref <150)

## 2021-01-28 LAB — TSH: TSH: 0.6 mIU/L (ref 0.40–4.50)

## 2021-01-28 LAB — HEPATITIS C ANTIBODY
Hepatitis C Ab: NONREACTIVE
SIGNAL TO CUT-OFF: 0.04 (ref <1.00)

## 2021-01-28 LAB — HEMOGLOBIN A1C
Hgb A1c MFr Bld: 5.6 % of total Hgb (ref <5.7)
Mean Plasma Glucose: 114 mg/dL
eAG (mmol/L): 6.3 mmol/L

## 2021-01-28 LAB — PSA: PSA: 0.5 ng/mL (ref <=4.00)

## 2021-03-18 DIAGNOSIS — L700 Acne vulgaris: Secondary | ICD-10-CM | POA: Diagnosis not present

## 2021-04-12 ENCOUNTER — Other Ambulatory Visit: Payer: Self-pay

## 2021-04-12 ENCOUNTER — Encounter: Payer: Self-pay | Admitting: Podiatry

## 2021-04-12 ENCOUNTER — Ambulatory Visit: Payer: BC Managed Care – PPO | Admitting: Podiatry

## 2021-04-12 DIAGNOSIS — B351 Tinea unguium: Secondary | ICD-10-CM | POA: Diagnosis not present

## 2021-04-12 DIAGNOSIS — L989 Disorder of the skin and subcutaneous tissue, unspecified: Secondary | ICD-10-CM | POA: Diagnosis not present

## 2021-04-12 DIAGNOSIS — S99921A Unspecified injury of right foot, initial encounter: Secondary | ICD-10-CM | POA: Diagnosis not present

## 2021-04-12 DIAGNOSIS — M79676 Pain in unspecified toe(s): Secondary | ICD-10-CM | POA: Diagnosis not present

## 2021-04-23 NOTE — Progress Notes (Signed)
° °  HPI: 51 y.o. male presenting today for evaluation of an injury that patient sustained when he dropped a knife on his right fourth toe.  This was about 3 weeks ago.  He says the toe is now bruised.  He says there is no pain but he would like to have it evaluated. Patient also presents for follow-up evaluation of onychomycosis of the toenails.  This is been ongoing for several years. Finally the patient has a symptomatic callus to the plantar aspect of the bilateral feet that he would like to have evaluated.  Past Medical History:  Diagnosis Date   Wears dentures    partial upper    Past Surgical History:  Procedure Laterality Date   COLONOSCOPY WITH PROPOFOL N/A 06/13/2019   Procedure: COLONOSCOPY WITH PROPOFOL;  Surgeon: Lin Landsman, MD;  Location: [facility];  Service: Endoscopy;  Laterality: N/A;  Priority 4   THUMB ARTHROSCOPY  05/08/2018   Fracture repair.  [facility]    No Known Allergies   Physical Exam: General: The patient is alert and oriented x3 in no acute distress.  Dermatology: Skin is warm, dry and supple bilateral lower extremities. Negative for open lesions or macerations.  Hyperkeratotic dystrophic nails noted 1-5 bilateral.  There is also some hyperkeratotic callus tissue to the weightbearing surfaces of the bilateral feet  Vascular: Palpable pedal pulses bilaterally. Capillary refill within normal limits.  Negative for any significant edema or erythema  Neurological: Light touch and protective threshold grossly intact  Musculoskeletal Exam: No pedal deformities noted  Radiographic Exam:  Normal osseous mineralization. Joint spaces preserved. No fracture/dislocation/boney destruction.  Specifically no fracture to the fourth toe of the right foot  Assessment: 1.  Injury fourth toe right foot 2.  Porokeratosis/benign skin lesion bilateral feet 3.  Onychomycosis of toenails bilateral   Plan of Care:  1. Patient evaluated. X-Rays reviewed.   2.  Today we discussed different treatment options for the onychomycosis of the toenails.  OTC Tolcylen antifungal topical was provided for the patient 3.  Excisional debridement of the hyperkeratotic porokeratosis was performed using a 312 scalpel without incident or bleeding 4.  Return to clinic as needed      Edrick Kins, DPM [facility]  Dr. Edrick Kins, DPM    [address], [place] [address]                Office [phone]  Fax [phone]

## 2021-05-04 ENCOUNTER — Other Ambulatory Visit: Payer: Self-pay

## 2021-05-04 ENCOUNTER — Ambulatory Visit (INDEPENDENT_AMBULATORY_CARE_PROVIDER_SITE_OTHER): Payer: BC Managed Care – PPO | Admitting: Family Medicine

## 2021-05-04 ENCOUNTER — Encounter: Payer: Self-pay | Admitting: Family Medicine

## 2021-05-04 VITALS — BP 129/74 | HR 63 | Ht 74.0 in | Wt 223.6 lb

## 2021-05-04 DIAGNOSIS — M25511 Pain in right shoulder: Secondary | ICD-10-CM

## 2021-05-04 DIAGNOSIS — G8929 Other chronic pain: Secondary | ICD-10-CM | POA: Diagnosis not present

## 2021-05-04 DIAGNOSIS — M7551 Bursitis of right shoulder: Secondary | ICD-10-CM

## 2021-05-04 MED ORDER — BACLOFEN 10 MG PO TABS: 5.0000 mg | ORAL_TABLET | Freq: Every evening | ORAL | 3 refills | Status: DC | PRN

## 2021-05-04 NOTE — Patient Instructions (Addendum)
Thank you for coming to the office today.  Most likely you have bursitis of your shoulder. This is inflammation of the shoulder joint caused most often by arthritis or wear and tear. Often it can flare up to cause bursitis due to repetitive activities or other triggers. It may take time to heal, possibly 2 to 6 weeks, and it is important to avoid over use of shoulder especially above head motions that can re-aggravate the problem.  START anti inflammatory topical - OTC Voltaren (generic Diclofenac) topical 2-4 times a day as needed for pain swelling of affected joint for 1-2 weeks or longer.  Recommend to start taking Tylenol Extra Strength 500mg  tabs - take 1 to 2 tabs per dose (max 1000mg ) every 6-8 hours for pain (take regularly, don't skip a dose for next 7 days), max 24 hour daily dose is 6 tablets or 3000mg . In the future you can repeat the same everyday Tylenol course for 1-2 weeks at a time.   Start taking Baclofen (Lioresal) 10mg  (muscle relaxant) - start with half (cut) to one whole pill at night as needed for next 1-3 nights (may make you drowsy, caution with driving) see how it affects you, then if tolerated increase to one pill 2 to 3 times a day or (every 8 hours as needed)   Also we can refer you to Physical Therapy if not improving or consider steroid injection.  Goal to modify repetitive lifting reaching above shoulder.   Please schedule a Follow-up Appointment to: Return in about 4 months (around 09/01/2021) for 4 month follow-up FMLA recert R Shoulder bursitis / BP check.  If you have any other questions or concerns, please feel free to call the office or send a message through [place]. You may also schedule an earlier appointment if necessary.  Additionally, you may be receiving a survey about your experience at our office within a few days to 1 week by e-mail or mail. We value your feedback.  Nobie Putnam, DO [facility], [facility]  Range of Motion  Shoulder Exercises  [place] with your good arm against a counter or table for support [facility] forward with a wide stance (make sure your body is comfortable) - Your painful shoulder should hang down and feel "heavy" - Gently move your painful arm in small circles "clockwise" for several turns - Switch to "counterclockwise" for several turns - Early on keep circles narrow and move slowly - Later in rehab, move in larger circles and faster movement   Wall Crawl - Stand close (about 1-2 ft away) to a wall, facing it directly - Reach out with your arm of painful shoulder and place fingers (not palm) on wall - You should make contact with wall at your waist level - Slowly walk your fingers up the wall. Stay in contact with wall entire time, do not remove fingers - Keep walking fingers up wall until you reach shoulder level - You may feel tightening or mild discomfort, once you reach a height that causes pain or if you are already above your shoulder height then stop. Repeat from starting position. - Early on stand closer to wall, move fingers slowly, and stay at or below shoulder level - Later in rehab, stand farther away from wall (fingertips), move fingers quicker, go above shoulder level

## 2021-05-04 NOTE — Progress Notes (Signed)
Subjective:    Patient ID: Samuel Davis, male    DOB: 10/31/1970, 51 y.o.   MRN: [id number]  Samuel Davis is a 51 y.o. male presenting on 05/04/2021 for Arthritis   HPI  Right Shoulder, chronic tendonitis / osteoarthritis Reports chronic issue with episodic flares R shoulder pain. He has seen the [facility] specialist back in 12/2020 and had X-rays and diagnosed with arthritis and bursitis, symptoms worse at night, difficulty laying on R shoulder. Worse with repetitive activity. He has tried NSAID oral PRN with some temporary relief but decided to avoid nsaid longer term. - He was given some home exercise regimen. Also given advice on Physical Therapy but she   He does work involving reaching above shoulder for work, often lifting and reaching from that height.  Has not had traumatic injury on R shoulder. No prior injection or surgery. Some days better, others it flares up. Has episodic flare that requires him to not be able to perform job and may need time off due to this medical reason  Elevated BP without HTN Prior history similar pattern with elevated initial electronic cuff then repeat reading improved Not on medication. Not checking BP outside office    Depression screen [facility] 2/9 01/27/2021 10/04/2020 12/19/2019  Decreased Interest 0 0 0  Down, Depressed, Hopeless 0 0 0  PHQ - 2 Score 0 0 0  Altered sleeping 0 0 -  Tired, decreased energy 0 0 -  Change in appetite 0 0 -  Feeling bad or failure about yourself  0 0 -  Trouble concentrating 0 0 -  Moving slowly or fidgety/restless 0 0 -  Suicidal thoughts 0 0 -  PHQ-9 Score 0 0 -  Difficult doing work/chores Not difficult at all Not difficult at all -    Social History   Tobacco Use   Smoking status: Former    Packs/day: 0.75    Years: 10.00    Pack years: 7.50    Types: Cigarettes    Quit date: 09/08/2017    Years since quitting: 3.6   Smokeless tobacco: Former   Tobacco comments:    Quit cold [place]  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  Constitutional:      General: He is not in acute distress.    Appearance: Normal appearance. He is well-developed. He is not diaphoretic.     Comments: Well-appearing, comfortable, cooperative  HENT:     Head: Normocephalic and atraumatic.  Eyes:     General:        Right eye: No discharge.        Left eye: No discharge.     Conjunctiva/sclera: Conjunctivae normal.  Cardiovascular:  Rate and Rhythm: Normal rate.  Pulmonary:     Effort: Pulmonary effort is normal.  Musculoskeletal:     Comments: RIGHT Shoulder Inspection: Normal appearance bilateral symmetrical Palpation: Non-tender to palpation over anterior, lateral, or posterior shoulder  ROM: Reduced active ROM abduction limited above shoulder level, forward flexion intact, limited internal rotation behind back Special Testing: Rotator cuff testing negative for weakness with supraspinatus full can and empty can test, Impingement testing POSITIVE R shoulder Strength: Normal strength 5/5 flex/ext, ext rot / int rot, grip, rotator cuff str testing. Neurovascular: Distally intact pulses, sensation to light touch   Skin:    General: Skin is warm and dry.     Findings: No erythema or rash.  Neurological:     Mental Status: He is alert and oriented to person, place, and time.  Psychiatric:        Mood and Affect: Mood normal.         Behavior: Behavior normal.        Thought Content: Thought content normal.     Comments: Well groomed, good eye contact, normal speech and thoughts     Results for orders placed or performed in visit on 01/27/21  COMPLETE METABOLIC PANEL WITH GFR  Result Value Ref Range   Glucose, Bld 108 65 - 139 mg/dL   BUN 19 7 - 25 mg/dL   Creat 1.33 (H) 0.60 - 1.29 mg/dL   eGFR 66 > OR = 60 mL/min/1.53m   BUN/Creatinine Ratio 14 6 - 22 (calc)   Sodium 141 135 - 146 mmol/L   Potassium 4.5 3.5 - 5.3 mmol/L   Chloride 106 98 - 110 mmol/L   CO2 27 20 - 32 mmol/L   Calcium 9.3 8.6 - 10.3 mg/dL   Total Protein 6.6 6.1 - 8.1 g/dL   Albumin 4.3 3.6 - 5.1 g/dL   Globulin 2.3 1.9 - 3.7 g/dL (calc)   AG Ratio 1.9 1.0 - 2.5 (calc)   Total Bilirubin 0.5 0.2 - 1.2 mg/dL   Alkaline phosphatase (APISO) 38 36 - 130 U/L   AST 31 10 - 40 U/L   ALT 37 9 - 46 U/L  Lipid panel  Result Value Ref Range   Cholesterol 223 (H) <200 mg/dL   HDL 85 > OR = 40 mg/dL   Triglycerides 63 <150 mg/dL   LDL Cholesterol (Calc) 123 (H) mg/dL (calc)   Total CHOL/HDL Ratio 2.6 <5.0 (calc)   Non-HDL Cholesterol (Calc) 138 (H) <130 mg/dL (calc)  CBC with Differential/Platelet  Result Value Ref Range   WBC 3.9 3.8 - 10.8 Thousand/uL   RBC 5.60 4.20 - 5.80 Million/uL   Hemoglobin 12.9 (L) 13.2 - 17.1 g/dL   HCT 42.4 38.5 - 50.0 %   MCV 75.7 (L) 80.0 - 100.0 fL   MCH 23.0 (L) 27.0 - 33.0 pg   MCHC 30.4 (L) 32.0 - 36.0 g/dL   RDW 14.9 11.0 - 15.0 %   Platelets 180 140 - 400 Thousand/uL   MPV 11.2 7.5 - 12.5 fL   Neutro Abs 2,165 1,500 - 7,800 cells/uL   Lymphs Abs 1,135 850 - 3,900 cells/uL   Absolute Monocytes 371 200 - 950 cells/uL   Eosinophils Absolute 191 15 - 500 cells/uL   Basophils Absolute 39 0 - 200 cells/uL   Neutrophils Relative % 55.5 %   Total Lymphocyte 29.1 %   Monocytes Relative 9.5 %   Eosinophils Relative 4.9 %   Basophils Relative 1.0 %  Hemoglobin A1c  Result Value Ref Range   Hgb A1c MFr Bld  5.6 <5.7 % of total Hgb   Mean Plasma Glucose 114 mg/dL   eAG (mmol/L) 6.3 mmol/L  PSA  Result Value Ref Range   PSA 0.50 < OR = 4.00 ng/mL  Hepatitis C antibody  Result Value Ref Range   Hepatitis C Ab NON-REACTIVE NON-REACTIVE   SIGNAL TO CUT-OFF 0.04 <1.00  TSH  Result Value Ref Range   TSH 0.60 0.40 - 4.50 mIU/L      Assessment & Plan:   Problem List Items Addressed This Visit   None Visit Diagnoses     Chronic bursitis of right shoulder    -  Primary   Relevant Medications   baclofen (LIORESAL) 10 MG tablet   Chronic right shoulder pain       Relevant Medications   baclofen (LIORESAL) 10 MG tablet       Consistent with subacute RIGHT-shoulder bursitis vs rotator cuff tendinopathy with some reduced active ROM but without significant evidence of muscle tear (no weakness).  Known repetitive overhead/strenuous activity as likely etiology  No clear etiology of injury.  51 yr old patient with likely underlying arthritis - Imaging per Orthopedics ([facility] 12/2020)  Plan: 1. START anti inflammatory topical - OTC Voltaren (generic Diclofenac) topical 2-4 times a day as needed for pain swelling of affected joint for 1-2 weeks or longer. - Cannot tolerate oral NSAIDs 2. Rx Baclofen muscle relaxant 5-54m QHS PRN caution sedation 3. May take Tylenol Ex Str 1-2 q 6 hr PRN 4. Relative rest but keep shoulder mobile, demonstrated ROM exercises, avoid heavy lifting 5. May try heating pad PRN Future if not improved for re-evaluation, consider referral to Physical Therapy, X-rays, and or subacromial steroid injection   Meds ordered this encounter  Medications   baclofen (LIORESAL) 10 MG tablet    Sig: Take 0.5-1 tablets (5-10 mg total) by mouth at bedtime as needed for muscle spasms.    Dispense:  30 each    Refill:  3   FMLA Intermittent Requested date 04/18/21 1st missed date of work 04/20/21 Intermittent only Leave for medical apt twice per year or 1 visit q 6 month for  1 day  Anticipate duration 12+ months, may require renewal yearly or unless problem resolves Frequency 2 flares per month, duration 1-2 days per episode Will complete [facility] today [facility] patient will be notified to pick up copy  Follow up plan: Return in about 4 months (around 09/01/2021) for 4 month follow-up FMLA recert R Shoulder bursitis / BP check.   ANobie Putnam [facility] 05/04/2021, 3:02 PM

## 2021-08-12 ENCOUNTER — Encounter: Payer: Self-pay | Admitting: Family Medicine

## 2021-08-12 ENCOUNTER — Ambulatory Visit (INDEPENDENT_AMBULATORY_CARE_PROVIDER_SITE_OTHER): Payer: BC Managed Care – PPO | Admitting: Family Medicine

## 2021-08-12 DIAGNOSIS — M25511 Pain in right shoulder: Secondary | ICD-10-CM | POA: Diagnosis not present

## 2021-08-12 DIAGNOSIS — G8929 Other chronic pain: Secondary | ICD-10-CM

## 2021-08-12 DIAGNOSIS — M7551 Bursitis of right shoulder: Secondary | ICD-10-CM

## 2021-08-12 MED ORDER — BACLOFEN 10 MG PO TABS: 5.0000 mg | ORAL_TABLET | Freq: Every evening | ORAL | 4 refills | Status: DC | PRN

## 2021-08-12 NOTE — Progress Notes (Signed)
? ?Subjective:  ? ? Patient ID: Samuel Davis, male    DOB: 16-Aug-1970, 51 y.o.   MRN: [id number] ? ?Samuel Davis is a 51 y.o. male presenting on 08/12/2021 for Shoulder Pain ? ? ?HPI ? ?Right Shoulder, chronic tendonitis / osteoarthritis ?Reports chronic issue with episodic flares R shoulder pain. He has seen the [facility] specialist back in 12/2020 and had X-rays and diagnosed with arthritis and bursitis, symptoms worse at night, difficulty laying on R shoulder. Worse with repetitive activity. He has tried NSAID oral PRN with some temporary relief but decided to avoid nsaid longer term. ?- He was given some home exercise regimen. Also given advice on Physical Therapy but she  ?  ?He does work involving reaching above shoulder for work, often lifting and reaching from that height. ?  ?Has not had traumatic injury on R shoulder. No prior injection or surgery. ?Some days better, others it flares up. Has episodic flare that requires him to not be able to perform job and may need time off due to this medical reason ? ?Last visit with me for this issue 05/04/21, we have completed FMLA for intermittent leave due to shoulder bursitis inflammation flare ups, it was suggested he may have 2 flares per 1 month, but he has had more frequent flares bothering him and requested updated FMLA documentation. ? ?Today he is doing well. He reports missed 4/17, 4/20 and 5/1 due to shoulder flares. Now he will be changing work routine next week and hopefully avoiding stress on shoulder. ?  ?Elevated BP without HTN ?Prior history similar pattern with elevated initial electronic cuff then repeat reading improved ?Today with normal reading. ?Not on medication. Not checking BP outside office ? ? ?  01/27/2021  ?  8:20 AM 10/04/2020  ?  3:46 PM 12/19/2019  ?  9:20 AM  ?Depression screen PHQ 2/9  ?Decreased Interest 0 0 0  ?Down, Depressed, Hopeless 0 0 0  ?PHQ - 2 Score 0 0 0  ?Altered sleeping 0 0   ?Tired, decreased energy 0 0   ?Change in  appetite 0 0   ?Feeling bad or failure about yourself  0 0   ?Trouble concentrating 0 0   ?Moving slowly or fidgety/restless 0 0   ?Suicidal thoughts 0 0   ?PHQ-9 Score 0 0   ?Difficult doing work/chores Not difficult at all Not difficult at all   ? ? ?Social History  ? ?Tobacco Use  ? Smoking status: Former  ?  Packs/day: 0.75  ?  Years: 10.00  ?  Pack years: 7.50  ?  Types: Cigarettes  ?  Quit date: 09/08/2017  ?  Years since quitting: 3.9  ? Smokeless tobacco: Former  ? Tobacco comments:  ?  Quit cold [place]  ?Vaping Use  ? Vaping Use: Never used  ?Substance Use Topics  ? Alcohol use: Not Currently  ?  Alcohol/week: 7.0 standard drinks  ?  Types: 7 Cans of beer per week  ? Drug use: Never  ? ? ?Review of Systems ?Per HPI unless specifically indicated above ? ?   ?Objective:  ?  ?BP 136/84   Pulse 71   Ht '6\' 2"'  (1.88 m)   Wt 219 lb 3.2 oz (99.4 kg)   SpO2 99%   BMI 28.14 kg/m?   ?Wt Readings from Last 3 Encounters:  ?08/12/21 219 lb 3.2 oz (99.4 kg)  ?05/04/21 223 lb 9.6 oz (101.4 kg)  ?01/27/21 218 lb 9.6 oz (99.2 kg)  ?  ?  Physical Exam ?Vitals and nursing note reviewed.  ?Constitutional:   ?   General: He is not in acute distress. ?   Appearance: Normal appearance. He is well-developed. He is not diaphoretic.  ?   Comments: Well-appearing, comfortable, cooperative  ?HENT:  ?   Head: Normocephalic and atraumatic.  ?Eyes:  ?   General:     ?   Right eye: No discharge.     ?   Left eye: No discharge.  ?   Conjunctiva/sclera: Conjunctivae normal.  ?Cardiovascular:  ?   Rate and Rhythm: Normal rate.  ?Pulmonary:  ?   Effort: Pulmonary effort is normal.  ?Musculoskeletal:  ?   Comments: RIGHT Shoulder ?Inspection: Normal appearance bilateral symmetrical ?Palpation: Non-tender to palpation over anterior, lateral, or posterior shoulder  ?ROM: Improved but still reduced active ROM abduction limited above shoulder level, forward flexion intact, limited internal rotation behind back ?Special Testing: Rotator cuff  testing negative for weakness with supraspinatus full can and empty can test, Impingement testing POSITIVE R shoulder ?Strength: Normal strength 5/5 flex/ext, ext rot / int rot, grip, rotator cuff str testing. ?Neurovascular: Distally intact pulses, sensation to light touch ?  ?Skin: ?   General: Skin is warm and dry.  ?   Findings: No erythema or rash.  ?Neurological:  ?   Mental Status: He is alert and oriented to person, place, and time.  ?Psychiatric:     ?   Mood and Affect: Mood normal.     ?   Behavior: Behavior normal.     ?   Thought Content: Thought content normal.  ?   Comments: Well groomed, good eye contact, normal speech and thoughts  ? ?Results for orders placed or performed in visit on 01/27/21  ?COMPLETE METABOLIC PANEL WITH GFR  ?Result Value Ref Range  ? Glucose, Bld 108 65 - 139 mg/dL  ? BUN 19 7 - 25 mg/dL  ? Creat 1.33 (H) 0.60 - 1.29 mg/dL  ? eGFR 66 > OR = 60 mL/min/1.38m  ? BUN/Creatinine Ratio 14 6 - 22 (calc)  ? Sodium 141 135 - 146 mmol/L  ? Potassium 4.5 3.5 - 5.3 mmol/L  ? Chloride 106 98 - 110 mmol/L  ? CO2 27 20 - 32 mmol/L  ? Calcium 9.3 8.6 - 10.3 mg/dL  ? Total Protein 6.6 6.1 - 8.1 g/dL  ? Albumin 4.3 3.6 - 5.1 g/dL  ? Globulin 2.3 1.9 - 3.7 g/dL (calc)  ? AG Ratio 1.9 1.0 - 2.5 (calc)  ? Total Bilirubin 0.5 0.2 - 1.2 mg/dL  ? Alkaline phosphatase (APISO) 38 36 - 130 U/L  ? AST 31 10 - 40 U/L  ? ALT 37 9 - 46 U/L  ?Lipid panel  ?Result Value Ref Range  ? Cholesterol 223 (H) <200 mg/dL  ? HDL 85 > OR = 40 mg/dL  ? Triglycerides 63 <150 mg/dL  ? LDL Cholesterol (Calc) 123 (H) mg/dL (calc)  ? Total CHOL/HDL Ratio 2.6 <5.0 (calc)  ? Non-HDL Cholesterol (Calc) 138 (H) <130 mg/dL (calc)  ?CBC with Differential/Platelet  ?Result Value Ref Range  ? WBC 3.9 3.8 - 10.8 Thousand/uL  ? RBC 5.60 4.20 - 5.80 Million/uL  ? Hemoglobin 12.9 (L) 13.2 - 17.1 g/dL  ? HCT 42.4 38.5 - 50.0 %  ? MCV 75.7 (L) 80.0 - 100.0 fL  ? MCH 23.0 (L) 27.0 - 33.0 pg  ? MCHC 30.4 (L) 32.0 - 36.0 g/dL  ? RDW 14.9  11.0 - 15.0 %  ?  Platelets 180 140 - 400 Thousand/uL  ? MPV 11.2 7.5 - 12.5 fL  ? Neutro Abs 2,165 1,500 - 7,800 cells/uL  ? Lymphs Abs 1,135 850 - 3,900 cells/uL  ? Absolute Monocytes 371 200 - 950 cells/uL  ? Eosinophils Absolute 191 15 - 500 cells/uL  ? Basophils Absolute 39 0 - 200 cells/uL  ? Neutrophils Relative % 55.5 %  ? Total Lymphocyte 29.1 %  ? Monocytes Relative 9.5 %  ? Eosinophils Relative 4.9 %  ? Basophils Relative 1.0 %  ?Hemoglobin A1c  ?Result Value Ref Range  ? Hgb A1c MFr Bld 5.6 <5.7 % of total Hgb  ? Mean Plasma Glucose 114 mg/dL  ? eAG (mmol/L) 6.3 mmol/L  ?PSA  ?Result Value Ref Range  ? PSA 0.50 < OR = 4.00 ng/mL  ?Hepatitis C antibody  ?Result Value Ref Range  ? Hepatitis C Ab NON-REACTIVE NON-REACTIVE  ? SIGNAL TO CUT-OFF 0.04 <1.00  ?TSH  ?Result Value Ref Range  ? TSH 0.60 0.40 - 4.50 mIU/L  ? ?   ?Assessment & Plan:  ? ?Problem List Items Addressed This Visit   ?None ?Visit Diagnoses   ? ? Chronic bursitis of right shoulder      ? Relevant Medications  ? baclofen (LIORESAL) 10 MG tablet  ? Chronic right shoulder pain      ? Relevant Medications  ? baclofen (LIORESAL) 10 MG tablet  ? ?  ?  ?Consistent with subacute RIGHT-shoulder bursitis vs rotator cuff tendinopathy with some reduced active ROM but without significant evidence of muscle tear (no weakness).  ?Known repetitive overhead/strenuous activity as likely etiology  ?No clear etiology of injury.  ?51 yr old patient with likely underlying arthritis ?- Imaging per Orthopedics ([facility] 12/2020) ?  ?Plan: ?1. Continue anti inflammatory topical - OTC Voltaren (generic Diclofenac) topical 2-4 times a day as needed for pain swelling of affected joint for 1-2 weeks or longer. ?- Cannot tolerate oral NSAIDs ?2. Re order Baclofen muscle relaxant 5-24m QHS PRN caution sedation ?3. May take Tylenol Ex Str 1-2 q 6 hr PRN ?4. Relative rest but keep shoulder mobile, demonstrated ROM exercises, avoid heavy lifting ?5. May try heating pad  PRN ? ?Future if not improved for re-evaluation, consider referral to Physical Therapy, X-rays, and or subacromial steroid injection ? ?Updated FMLA documentation today. Change form, new information submitted as be

## 2021-08-12 NOTE — Patient Instructions (Addendum)
Thank you for coming to the office today. ? ?Completed form today with increased amount of days for intermittent leave episodes from 2 to 4 due to experiencing more flare up symptoms. ? ?Also updated it to say 1-2 office visits per 6 months. 1 day per appointment. ? ?Blood pressure looks good today ? ?Please schedule a Follow-up Appointment to: Return if symptoms worsen or fail to improve, for keep upcoming apt 01/2022. ? ?If you have any other questions or concerns, please feel free to call the office or send a message through [place]. You may also schedule an earlier appointment if necessary. ? ?Additionally, you may be receiving a survey about your experience at our office within a few days to 1 week by e-mail or mail. We value your feedback. ? ?Nobie Putnam, DO ?[place] ?

## 2021-08-22 ENCOUNTER — Ambulatory Visit: Payer: Self-pay

## 2021-08-22 NOTE — Telephone Encounter (Signed)
?  Chief Complaint: quarter sized boil ?Symptoms: itching and swelling ?Frequency: since yesterday ?Pertinent Negatives: Patient denies fever, rash elsewhere, weakness ?Disposition: '[]'$ ED /'[]'$ Urgent Care (no appt availability in office) / '[x]'$ Appointment(In office/virtual)/ '[]'$  [facility]/ '[]'$ Home Care/ '[]'$ Refused Recommended Disposition /'[]'$ [facility]/ '[]'$  Follow-up with PCP ?Additional Notes: pt accepted appt  for 08/23/21 with Webb Silversmith NP- no available appt's within timeframe with PCP ? ? ? ? ? ? ? ?Reason for Disposition ? Boil > 1/2 inch across (> 12 mm; larger than a marble) ? ?Answer Assessment - Initial Assessment Questions ?1. APPEARANCE of BOIL: "What does the boil look like?"  ?    Swollen, red and itchy ?2. LOCATION: "Where is the boil located?"  ?    Collarbone right side ?3. NUMBER: "How many boils are there?"  ?    1 ?4. SIZE: "How big is the boil?" (e.g., inches, cm; compare to size of a coin or other object) ?    quarter ?5. ONSET: "When did the boil start?" ?    yesterday ?6. PAIN: "Is there any pain?" If Yes, ask: "How bad is the pain?"   (Scale 1-10; or mild, moderate, severe) ?    no ?7. FEVER: "Do you have a fever?" If Yes, ask: "What is it, how was it measured, and when did it start?"  ?    no ?8. SOURCE: "Have you been around anyone with boils or other Staph infections?" "Have you ever had boils before?" ?    No-no ?9. OTHER SYMPTOMS: "Do you have any other symptoms?" (e.g., shaking chills, weakness, rash elsewhere on body) ?    itching ?10. PREGNANCY: "Is there any chance you are pregnant?" "When was your last menstrual period?" ?      N/a ? ?Protocols used: Boil (Skin Abscess)-A-AH ? ?

## 2021-08-23 ENCOUNTER — Ambulatory Visit: Payer: BC Managed Care – PPO | Admitting: Internal Medicine

## 2021-08-23 ENCOUNTER — Encounter: Payer: Self-pay | Admitting: Internal Medicine

## 2021-08-23 VITALS — BP 112/72 | HR 61 | Temp 97.1°F | Wt 222.0 lb

## 2021-08-23 DIAGNOSIS — L239 Allergic contact dermatitis, unspecified cause: Secondary | ICD-10-CM | POA: Diagnosis not present

## 2021-08-23 MED ORDER — TRIAMCINOLONE ACETONIDE 0.1 % EX CREA: 1.0000 "application " | TOPICAL_CREAM | Freq: Two times a day (BID) | CUTANEOUS | 0 refills | Status: DC

## 2021-08-23 MED ORDER — METHYLPREDNISOLONE ACETATE 80 MG/ML IJ SUSP
80.0000 mg | Freq: Once | INTRAMUSCULAR | Status: AC
  Administered 2021-08-23: 80 mg via INTRAMUSCULAR

## 2021-08-23 NOTE — Addendum Note (Signed)
Addended by: Ashley Royalty E on: 08/23/2021 04:10 PM ? ? Modules accepted: Orders ? ?

## 2021-08-23 NOTE — Patient Instructions (Signed)
Contact Dermatitis Dermatitis is redness, soreness, and swelling (inflammation) of the skin. Contact dermatitis is a reaction to something that touches the skin. There are two types of contact dermatitis: Irritant contact dermatitis. This happens when something bothers (irritates) your skin, like soap. Allergic contact dermatitis. This is caused when you are exposed to something that you are allergic to, such as poison ivy. What are the causes? Common causes of irritant contact dermatitis include: Makeup. Soaps. Detergents. Bleaches. Acids. Metals, such as nickel. Common causes of allergic contact dermatitis include: Plants. Chemicals. Jewelry. Latex. Medicines. Preservatives in products, such as clothing. What increases the risk? Having a job that exposes you to things that bother your skin. Having asthma or eczema. What are the signs or symptoms? Symptoms may happen anywhere the irritant has touched your skin. Symptoms include: Dry or flaky skin. Redness. Cracks. Itching. Pain or a burning feeling. Blisters. Blood or clear fluid draining from skin cracks. With allergic contact dermatitis, swelling may occur. This may happen in places such as the eyelids, mouth, or genitals. How is this treated? This condition is treated by checking for the cause of the reaction and protecting your skin. Treatment may also include: Steroid creams, ointments, or medicines. Antibiotic medicines or other ointments, if you have a skin infection. Lotion or medicines to help with itching. A bandage (dressing). Follow these instructions at home: Skin care Moisturize your skin as needed. Put cool cloths on your skin. Put a baking soda paste on your skin. Stir water into baking soda until it looks like a paste. Do not scratch your skin. Avoid having things rub up against your skin. Avoid the use of soaps, perfumes, and dyes. Medicines Take or apply over-the-counter and prescription medicines  only as told by your doctor. If you were prescribed an antibiotic medicine, take or apply it as told by your doctor. Do not stop using it even if your condition starts to get better. Bathing Take a bath with: Epsom salts. Baking soda. Colloidal oatmeal. Bathe less often. Bathe in warm water. Avoid using hot water. Bandage care If you were given a bandage, change it as told by your doctor. Wash your hands with soap and water before and after you change your bandage. If soap and water are not available, use hand sanitizer. General instructions Avoid the things that caused your reaction. If you do not know what caused it, keep a journal. Write down: What you eat. What skin products you use. What you drink. What you wear in the area that has symptoms. This includes jewelry. Check the affected areas every day for signs of infection. Check for: More redness, swelling, or pain. More fluid or blood. Warmth. Pus or a bad smell. Keep all follow-up visits as told by your doctor. This is important. Contact a doctor if: You do not get better with treatment. Your condition gets worse. You have signs of infection, such as: More swelling. Tenderness. More redness. Soreness. Warmth. You have a fever. You have new symptoms. Get help right away if: You have a very bad headache. You have neck pain. Your neck is stiff. You throw up (vomit). You feel very sleepy. You see red streaks coming from the area. Your bone or joint near the area hurts after the skin has healed. The area turns darker. You have trouble breathing. Summary Dermatitis is redness, soreness, and swelling of the skin. Symptoms may occur where the irritant has touched you. Treatment may include medicines and skin care. If you do not   know what caused your reaction, keep a journal. Contact a doctor if your condition gets worse or you have signs of infection. This information is not intended to replace advice given to you by  your health care provider. Make sure you discuss any questions you have with your health care provider. Document Revised: 01/10/2021 Document Reviewed: 01/10/2021 Elsevier Patient Education  2023 [employer]  

## 2021-08-23 NOTE — Progress Notes (Signed)
? ?Subjective:  ? ? Patient ID: Samuel Davis, male    DOB: 12/01/1970, 51 y.o.   MRN: [id number] ? ?HPI ? ?Pt presents to the clinic today with c/o a rash above his right collarbone.  He noticed this 3 days ago.  He reports the rash itches.  It has not seemed to spread.  He denies changes in soaps, lotions, detergents, medications or diet.  He reports he was outside playing basketball with his nephew over the weekend.  He does not think he has been exposed to any poison ivy or oak.  He has not tried anything OTC for this. ? ?Review of Systems ? ?   ?Past Medical History:  ?Diagnosis Date  ? Wears dentures   ? partial upper  ? ? ?Current Outpatient Medications  ?Medication Sig Dispense Refill  ? baclofen (LIORESAL) 10 MG tablet Take 0.5-1 tablets (5-10 mg total) by mouth at bedtime as needed for muscle spasms. 30 each 4  ? diclofenac Sodium (VOLTAREN) 1 % GEL Apply 2 g topically 4 (four) times daily. 100 g 2  ? ?No current facility-administered medications for this visit.  ? ? ?No Known Allergies ? ?Family History  ?Problem Relation Age of Onset  ? Colon cancer Father 28  ?     possibly earlier, but not diagnosed  ? Hypertension Sister   ? ? ?Social History  ? ?Socioeconomic History  ? Marital status: Single  ?  Spouse name: Not on file  ? Number of children: Not on file  ? Years of education: [place], [place]  ? Highest education level: Associate degree: occupational, Hotel manager, or vocational program  ?Occupational History  ? Occupation: Distribution  ?Tobacco Use  ? Smoking status: Former  ?  Packs/day: 0.75  ?  Years: 10.00  ?  Pack years: 7.50  ?  Types: Cigarettes  ?  Quit date: 09/08/2017  ?  Years since quitting: 3.9  ? Smokeless tobacco: Former  ? Tobacco comments:  ?  Quit cold [place]  ?Vaping Use  ? Vaping Use: Never used  ?Substance and Sexual Activity  ? Alcohol use: Not Currently  ?  Alcohol/week: 7.0 standard drinks  ?  Types: 7 Cans of beer per week  ? Drug use: Never  ? Sexual activity: Not on file  ?Other  Topics Concern  ? Not on file  ?Social History Narrative  ? Not on file  ? ?Social Determinants of Health  ? ?Financial Resource Strain: Not on file  ?Food Insecurity: Not on file  ?Transportation Needs: Not on file  ?Physical Activity: Not on file  ?Stress: Not on file  ?Social Connections: Not on file  ?Intimate Partner Violence: Not on file  ? ? ? ?Constitutional: Denies fever, malaise, fatigue, headache or abrupt weight changes.  ?Respiratory: Denies difficulty breathing, shortness of breath, cough or sputum production.   ?Cardiovascular: Denies chest pain, chest tightness, palpitations or swelling in the hands or feet.  ?Skin: Pt reports rash of chest. Denies lesions ulcercations.  ? ? ?No other specific complaints in a complete review of systems (except as listed in HPI above). ? ?Objective:  ? Physical Exam ? ?BP 112/72 (BP Location: Right Arm, Patient Position: Sitting, Cuff Size: Large)   Pulse 61   Temp (!) 97.1 ?F (36.2 ?C) (Temporal)   Wt 222 lb (100.7 kg)   SpO2 98%   BMI 28.50 kg/m?  ? ?Wt Readings from Last 3 Encounters:  ?08/12/21 219 lb 3.2 oz (99.4 kg)  ?05/04/21 223  lb 9.6 oz (101.4 kg)  ?01/27/21 218 lb 9.6 oz (99.2 kg)  ? ? ?General: Appears his stated age,  in NAD. ?Skin: Warm, dry and intact.  Grouped maculopapular rash noted just superior to the right collarbone at the sternoclavicular joint ?Cardiovascular: Normal rate. ?Pulmonary/Chest: Normal effort. ?Neurological: Alert and oriented.  ? ? ?BMET ?   ?Component Value Date/Time  ? NA 141 01/27/2021 0857  ? K 4.5 01/27/2021 0857  ? CL 106 01/27/2021 0857  ? CO2 27 01/27/2021 0857  ? GLUCOSE 108 01/27/2021 0857  ? BUN 19 01/27/2021 0857  ? CREATININE 1.33 (H) 01/27/2021 0857  ? CALCIUM 9.3 01/27/2021 0857  ? GFRNONAA 103 12/19/2019 0958  ? GFRAA 119 12/19/2019 0958  ? ? ?Lipid Panel  ?   ?Component Value Date/Time  ? CHOL 223 (H) 01/27/2021 0857  ? TRIG 63 01/27/2021 0857  ? HDL 85 01/27/2021 0857  ? CHOLHDL 2.6 01/27/2021 0857  ? LDLCALC  123 (H) 01/27/2021 0857  ? ? ?CBC ?   ?Component Value Date/Time  ? WBC 3.9 01/27/2021 0857  ? RBC 5.60 01/27/2021 0857  ? HGB 12.9 (L) 01/27/2021 0857  ? HCT 42.4 01/27/2021 0857  ? PLT 180 01/27/2021 0857  ? MCV 75.7 (L) 01/27/2021 0857  ? MCH 23.0 (L) 01/27/2021 0857  ? MCHC 30.4 (L) 01/27/2021 0857  ? RDW 14.9 01/27/2021 0857  ? LYMPHSABS 1,135 01/27/2021 0857  ? EOSABS 191 01/27/2021 0857  ? BASOSABS 39 01/27/2021 0857  ? ? ?Hgb A1C ?Lab Results  ?Component Value Date  ? HGBA1C 5.6 01/27/2021  ? ? ? ? ? ? ? ?   ?Assessment & Plan:  ? ?Contact Dermatitis: ? ?Unknown origin although it looks very similar to poison ivy ?80 mg Depo-Medrol IM x1 ?Rx for Triamcinolone cream 0.1% twice daily as needed ? ?Update me by the end of the week with new or persistent symptoms ?Webb Silversmith, NP ? ? ?

## 2021-09-02 ENCOUNTER — Ambulatory Visit: Payer: BC Managed Care – PPO | Admitting: Family Medicine

## 2021-09-16 ENCOUNTER — Encounter: Payer: Self-pay | Admitting: Family Medicine

## 2021-09-16 ENCOUNTER — Ambulatory Visit: Payer: BC Managed Care – PPO | Admitting: Family Medicine

## 2021-09-16 VITALS — BP 138/84 | HR 70 | Ht 74.0 in | Wt 223.8 lb

## 2021-09-16 DIAGNOSIS — N529 Male erectile dysfunction, unspecified: Secondary | ICD-10-CM | POA: Insufficient documentation

## 2021-09-16 DIAGNOSIS — R030 Elevated blood-pressure reading, without diagnosis of hypertension: Secondary | ICD-10-CM | POA: Diagnosis not present

## 2021-09-16 MED ORDER — SILDENAFIL CITRATE 20 MG PO TABS: ORAL_TABLET | ORAL | 3 refills | Status: DC

## 2021-09-16 NOTE — Assessment & Plan Note (Signed)
New problem ED Concern may be related to elevated BP at times, could be anxiety related and also w age 50+ may have some natural component. No other triggers known med / substance  Plan: 1. Trial on generic Sildenafil '20mg'$  tabs, take 1-5 tabs about 30 min prior to sexual activity, refills provided printed [facility] 2. Follow-up as needed  Future consider Urology if indicated.

## 2021-09-16 NOTE — Patient Instructions (Addendum)
Thank you for coming to the office today.  BP improved.  ---------------------  Start Sildenafil '20mg'$  - take 1 tablet about 30 min prior to sexual intercourse for improved erection. If this dose does not work or is not strong enough NEXT TIME you can increase to 2 pills for '40mg'$ . Maximum dose is 5 pills or '100mg'$ , most people end up taking 3-4 pills per dose and this decision is up to you based on the results.  Once you take a dose, you have to wait 24 hours to repeat a dose.  You will need to use www.goodrx.com website or app on phone to enter "Sildenafil" '20mg'$  medication and "Get Free Coupon" option to save for [facility] once you select pharmacy and # of pills. Show that coupon to pharmacy. Otherwise it will cost hundreds of dollars.  Follow up if not working or new concerns we can refer you to a Urologist.   Please schedule a Follow-up Appointment to: Return if symptoms worsen or fail to improve.  If you have any other questions or concerns, please feel free to call the office or send a message through [place]. You may also schedule an earlier appointment if necessary.  Additionally, you may be receiving a survey about your experience at our office within a few days to 1 week by e-mail or mail. We value your feedback.  Nobie Putnam, DO [place]

## 2021-09-16 NOTE — Progress Notes (Signed)
Subjective:    Patient ID: Samuel Davis, male    DOB: [birth year], 51 y.o.   MRN: [id number]  Samuel Davis is a 51 y.o. male presenting on 09/16/2021 for Erectile Dysfunction   HPI  Elevated BP without HTN Reports has BP cuff, not checking. History of elevated BP readings, usually re-check improved Current Meds - None, never on   Denies CP, dyspnea, HA, edema, dizziness / lightheadedness  Erectile Dysfunction Reports persistent problem with difficulty with obtaining and maintaining erection. He has intact libido and interest sexual desire. He occasionally will get spontaneous erection but less. Never on medication. Admits some anxiety related. Not on other med or substance that can cause.      08/23/2021   10:30 AM 08/23/2021    9:46 AM 01/27/2021    8:20 AM  Depression screen PHQ 2/9  Decreased Interest 0 0 0  Down, Depressed, Hopeless 0 0 0  PHQ - 2 Score 0 0 0  Altered sleeping 0 0 0  Tired, decreased energy 0 0 0  Change in appetite 0 0 0  Feeling bad or failure about yourself  0 0 0  Trouble concentrating 0 0 0  Moving slowly or fidgety/restless 0 0 0  Suicidal thoughts 0 0 0  PHQ-9 Score 0 0 0  Difficult doing work/chores Not difficult at all Not difficult at all Not difficult at all    Social History   Tobacco Use   Smoking status: Former    Packs/day: 0.75    Years: 10.00    Total pack years: 7.50    Types: Cigarettes    Quit date: 09/08/2017    Years since quitting: 4.0   Smokeless tobacco: Former   Tobacco comments:    Quit cold [place]  Scientific laboratory technician Use: Never used  Substance Use Topics   Alcohol use: Not Currently    Alcohol/week: 7.0 standard drinks of alcohol    Types: 7 Cans of beer per week   Drug use: Never    Review of Systems Per HPI unless specifically indicated above     Objective:    BP 138/84 (BP Location: Left Arm, Cuff Size: Normal)   Pulse 70   Ht _0  (1.88 m)   Wt 223 lb 12.8 oz (101.5 kg)   SpO2 99%   BMI 28.73 kg/m    Wt Readings from Last 3 Encounters:  09/16/21 223 lb 12.8 oz (101.5 kg)  08/23/21 222 lb (100.7 kg)  08/12/21 219 lb 3.2 oz (99.4 kg)    Physical Exam Vitals and nursing note reviewed.  Constitutional:      General: He is not in acute distress.    Appearance: Normal appearance. He is well-developed. He is not diaphoretic.     Comments: Well-appearing, comfortable, cooperative  HENT:     Head: Normocephalic and atraumatic.  Eyes:     General:        Right eye: No discharge.        Left eye: No discharge.     Conjunctiva/sclera: Conjunctivae normal.  Cardiovascular:     Rate and Rhythm: Normal rate.  Pulmonary:     Effort: Pulmonary effort is normal.  Skin:    General: Skin is warm and dry.     Findings: No erythema or rash.  Neurological:     Mental Status: He is alert and oriented to person, place, and time.  Psychiatric:        Mood and Affect: Mood normal.  Behavior: Behavior normal.        Thought Content: Thought content normal.     Comments: Well groomed, good eye contact, normal speech and thoughts      Results for orders placed or performed in visit on 01/27/21  COMPLETE METABOLIC PANEL WITH GFR  Result Value Ref Range   Glucose, Bld 108 65 - 139 mg/dL   BUN 19 7 - 25 mg/dL   Creat 1.33 (H) 0.60 - 1.29 mg/dL   eGFR 66 > OR = 60 mL/min/1.42m   BUN/Creatinine Ratio 14 6 - 22 (calc)   Sodium 141 135 - 146 mmol/L   Potassium 4.5 3.5 - 5.3 mmol/L   Chloride 106 98 - 110 mmol/L   CO2 27 20 - 32 mmol/L   Calcium 9.3 8.6 - 10.3 mg/dL   Total Protein 6.6 6.1 - 8.1 g/dL   Albumin 4.3 3.6 - 5.1 g/dL   Globulin 2.3 1.9 - 3.7 g/dL (calc)   AG Ratio 1.9 1.0 - 2.5 (calc)   Total Bilirubin 0.5 0.2 - 1.2 mg/dL   Alkaline phosphatase (APISO) 38 36 - 130 U/L   AST 31 10 - 40 U/L   ALT 37 9 - 46 U/L  Lipid panel  Result Value Ref Range   Cholesterol 223 (H) <200 mg/dL   HDL 85 > OR = 40 mg/dL   Triglycerides 63 <150 mg/dL   LDL Cholesterol (Calc) 123 (H)  mg/dL (calc)   Total CHOL/HDL Ratio 2.6 <5.0 (calc)   Non-HDL Cholesterol (Calc) 138 (H) <130 mg/dL (calc)  CBC with Differential/Platelet  Result Value Ref Range   WBC 3.9 3.8 - 10.8 Thousand/uL   RBC 5.60 4.20 - 5.80 Million/uL   Hemoglobin 12.9 (L) 13.2 - 17.1 g/dL   HCT 42.4 38.5 - 50.0 %   MCV 75.7 (L) 80.0 - 100.0 fL   MCH 23.0 (L) 27.0 - 33.0 pg   MCHC 30.4 (L) 32.0 - 36.0 g/dL   RDW 14.9 11.0 - 15.0 %   Platelets 180 140 - 400 Thousand/uL   MPV 11.2 7.5 - 12.5 fL   Neutro Abs 2,165 1,500 - 7,800 cells/uL   Lymphs Abs 1,135 850 - 3,900 cells/uL   Absolute Monocytes 371 200 - 950 cells/uL   Eosinophils Absolute 191 15 - 500 cells/uL   Basophils Absolute 39 0 - 200 cells/uL   Neutrophils Relative % 55.5 %   Total Lymphocyte 29.1 %   Monocytes Relative 9.5 %   Eosinophils Relative 4.9 %   Basophils Relative 1.0 %  Hemoglobin A1c  Result Value Ref Range   Hgb A1c MFr Bld 5.6 <5.7 % of total Hgb   Mean Plasma Glucose 114 mg/dL   eAG (mmol/L) 6.3 mmol/L  PSA  Result Value Ref Range   PSA 0.50 < OR = 4.00 ng/mL  Hepatitis C antibody  Result Value Ref Range   Hepatitis C Ab NON-REACTIVE NON-REACTIVE   SIGNAL TO CUT-OFF 0.04 <1.00  TSH  Result Value Ref Range   TSH 0.60 0.40 - 4.50 mIU/L      Assessment & Plan:   Problem List Items Addressed This Visit     Erectile dysfunction - Primary    New problem ED Concern may be related to elevated BP at times, could be anxiety related and also w age 50+may have some natural component. No other triggers known med / substance  Plan: 1. Trial on generic Sildenafil 284mtabs, take 1-5 tabs about 30 min prior to  sexual activity, refills provided printed [facility] 2. Follow-up as needed  Future consider Urology if indicated.      Relevant Medications   sildenafil (REVATIO) 20 MG tablet   Elevated BP without diagnosis of hypertension    Mildly elevated initial BP, repeat manual check improved. Prior history  reviewed - Home BP readings normal, checks occasional, electronic wrist cuff  No known complications  Fam history Sister HTN   Plan:  1. No medication 2. Encourage improved lifestyle - low sodium diet, regular exercise 3. Continue monitor BP outside office, bring readings to next visit, if persistently >140/90 or new symptoms notify office sooner  Discussed if ED could be related to BP. Seems to have anxiety component as well. Agree to defer treatment today.        Meds ordered this encounter  Medications   sildenafil (REVATIO) 20 MG tablet    Sig: Take 1-5 pills about 30 min prior to sex. Start with 1 and increase as needed.    Dispense:  90 tablet    Refill:  3      Follow up plan: Return if symptoms worsen or fail to improve.   Nobie Putnam, [facility] 09/16/2021, 2:54 PM

## 2021-09-16 NOTE — Assessment & Plan Note (Signed)
Mildly elevated initial BP, repeat manual check improved. Prior history reviewed - Home BP readings normal, checks occasional, electronic wrist cuff  No known complications  Fam history Sister HTN   Plan:  1. No medication 2. Encourage improved lifestyle - low sodium diet, regular exercise 3. Continue monitor BP outside office, bring readings to next visit, if persistently >140/90 or new symptoms notify office sooner  Discussed if ED could be related to BP. Seems to have anxiety component as well. Agree to defer treatment today.

## 2021-11-15 ENCOUNTER — Ambulatory Visit: Payer: BC Managed Care – PPO | Admitting: Family Medicine

## 2021-11-15 ENCOUNTER — Ambulatory Visit: Payer: Self-pay

## 2021-11-15 NOTE — Telephone Encounter (Signed)
2nd attempt, LVMTCB to discuss with a nurse or let us know if he doesn't need NT.

## 2021-11-15 NOTE — Telephone Encounter (Signed)
Patient called, left VM to return the call to the office to discuss symptoms with a nurse.  Summary: spot on the left side of his stomach   Pt called in stated he has a spot on the left side of his stomach stated this has gone down it was sore.  Pt had an appointment scheduled for today and pt forgot about appointment. Pt denied abdominal pain.   Rescheduled pt for Dr.K fist available 11/18/2021.   Pt seeking clinical advice.

## 2021-11-15 NOTE — Telephone Encounter (Signed)
3rd attempt, pt called, LVMTCB, will route to practice per protocol.

## 2021-11-18 ENCOUNTER — Ambulatory Visit: Payer: BC Managed Care – PPO | Admitting: Family Medicine

## 2021-12-15 DIAGNOSIS — Z03818 Encounter for observation for suspected exposure to other biological agents ruled out: Secondary | ICD-10-CM | POA: Diagnosis not present

## 2021-12-15 DIAGNOSIS — U071 COVID-19: Secondary | ICD-10-CM | POA: Diagnosis not present

## 2021-12-18 ENCOUNTER — Other Ambulatory Visit: Payer: Self-pay

## 2021-12-18 ENCOUNTER — Emergency Department
Admission: EM | Admit: 2021-12-18 | Discharge: 2021-12-18 | Disposition: A | Discharge status: Home / Self Care | Payer: BC Managed Care – PPO | Attending: Emergency Medicine | Admitting: Emergency Medicine

## 2021-12-18 ENCOUNTER — Encounter: Payer: Self-pay | Admitting: Emergency Medicine

## 2021-12-18 DIAGNOSIS — T63421A Toxic effect of venom of ants, accidental (unintentional), initial encounter: Secondary | ICD-10-CM | POA: Diagnosis not present

## 2021-12-18 MED ORDER — TRIAMCINOLONE ACETONIDE 0.1 % EX CREA: 1.0000 | TOPICAL_CREAM | Freq: Four times a day (QID) | CUTANEOUS | 0 refills | Status: DC

## 2021-12-18 NOTE — ED Provider Notes (Signed)
[facility] Provider Note  Patient Contact: 8:05 PM (approximate)   History   Insect Bite   HPI  Samuel Davis is a 52 y.o. male who presents the emergency department complaining of fire ant bites to the left wrist.  Patient states that he was bitten by an ant that underneath his watchband.  Yesterday he had some localized erythematous lesion from the bite but today it became pruritic and more raised.  No other systemic complaints.  Patient has not tried any medication prior to arrival.     Physical Exam   Triage Vital Signs: ED Triage Vitals  Enc Vitals Group     BP 12/18/21 1939 (!) 153/99     Pulse Rate 12/18/21 1939 61     Resp 12/18/21 1939 18     Temp 12/18/21 1939 98.7 F (37.1 C)     Temp Source 12/18/21 1939 Oral     SpO2 12/18/21 1939 96 %     Weight --      Height --      Head Circumference --      Peak Flow --      Pain Score 12/18/21 1859 0     Pain Loc --      Pain Edu? --      Excl. in [place]? --     Most recent vital signs: Vitals:   12/18/21 1939  BP: (!) 153/99  Pulse: 61  Resp: 18  Temp: 98.7 F (37.1 C)  SpO2: 96%     General: Alert and in no acute distress  Cardiovascular:  Good peripheral perfusion Respiratory: Normal respiratory effort without tachypnea or retractions. Lungs CTAB.  Musculoskeletal: Full range of motion to all extremities.  Visualization of the left wrist reveals scattered erythematous lesions consistent with fire ant bites.  Patient has very localized edema.  No open wounds.  No concern for cellulitis. Neurologic:  No gross focal neurologic deficits are appreciated.  Skin:   No rash noted Other:   ED Results / Procedures / Treatments   Labs (all labs ordered are listed, but only abnormal results are displayed) Labs Reviewed - No data to display   EKG     RADIOLOGY    No results found.  PROCEDURES:  Critical Care performed: No  Procedures   MEDICATIONS ORDERED IN  ED: Medications - No data to display   IMPRESSION / MDM / [place] / ED COURSE  I reviewed the triage vital signs and the nursing notes.                              Differential diagnosis includes, but is not limited to, fire ant bite, insect bite, bee sting, cellulitis  Patient's presentation is most consistent with acute presentation with potential threat to life or bodily function.   Patient's diagnosis is consistent with fire ant bite.  Patient presented to the emergency department complaining of fire ant bites to the left wrist.  Patient has localized edema consistent with a local reaction to the fire ant bite.  Patient is driving so I cannot give the patient Benadryl but will advise the patient to take either Benadryl or other antihistamine at home.  I will prescribe topical steroid for the patient.  Concerning signs and symptoms and return precaution discussed with the patient.  Otherwise follow-up primary care..  Patient is given ED precautions to return to the ED for any  worsening or new symptoms.        FINAL CLINICAL IMPRESSION(S) / ED DIAGNOSES   Final diagnoses:  Fire ant bite, accidental or unintentional, initial encounter     Rx / DC Orders   ED Discharge Orders          Ordered    triamcinolone cream (KENALOG) 0.1 %  4 times daily        12/18/21 2019             Note:  This document was prepared using Dragon voice recognition software and may include unintentional dictation errors.   Brynda Peon 12/18/21 2020    Rada Hay, MD 12/20/21 [phone]

## 2021-12-18 NOTE — ED Notes (Signed)
Pt states he was bit by fire ants yesterday while at work. Pt states that  this morning his left lower forearm started swelling, where he was bit. Pt denies any chest pain nor SOB. Mild swelling and redness noted on pt's left forearm.

## 2021-12-18 NOTE — Discharge Instructions (Addendum)
Use either loratadine or cetirizine as an oral medication.  The label will tell you to take 1, you may take 4 of these tablets daily for 5 days.  I have prescribed a topical steroid for you, however you may also pick up the over-the-counter steroid that goes under hydrocortisone.  Use what ever store brand such as [facility] or [facility] do not buy the brand name of Claritin or Zyrtec to save you money.

## 2021-12-18 NOTE — ED Triage Notes (Signed)
Pt to ED via POV c/o allergic reaction to fire ant bite. Pt states that he got bit yesterday while at work but the swelling did not started until today. Pt is in NAD.

## 2021-12-18 NOTE — ED Notes (Signed)
First Nurse Note: Pt to ED via POV. Pt states that he was bite by fire ants at work yesterday. Today he noticed that he had swelling in his left wrist and down into his hand. Pt is in NAD.

## 2021-12-19 ENCOUNTER — Encounter: Payer: Self-pay | Admitting: Family Medicine

## 2022-01-04 ENCOUNTER — Telehealth: Payer: Self-pay | Admitting: Family Medicine

## 2022-01-04 NOTE — Telephone Encounter (Signed)
We received fax from [employer] [facility] regarding patient's paperwork. He will need FMLA / Accommodation paperwork completed and submitted before 01/17/22.  Please schedule him for a visit to complete this, in person or virtual visit is fine to complete this.  Nobie Putnam, [facility] 01/04/2022, 5:29 PM

## 2022-01-05 NOTE — Telephone Encounter (Signed)
I sent him a MyChart message to call and schedule.

## 2022-01-13 ENCOUNTER — Encounter: Payer: Self-pay | Admitting: Family Medicine

## 2022-01-13 ENCOUNTER — Ambulatory Visit: Payer: BC Managed Care – PPO | Admitting: Family Medicine

## 2022-01-13 VITALS — BP 138/84 | HR 64 | Ht 74.0 in | Wt 223.5 lb

## 2022-01-13 DIAGNOSIS — Z23 Encounter for immunization: Secondary | ICD-10-CM

## 2022-01-13 DIAGNOSIS — M7551 Bursitis of right shoulder: Secondary | ICD-10-CM | POA: Diagnosis not present

## 2022-01-13 DIAGNOSIS — M25511 Pain in right shoulder: Secondary | ICD-10-CM

## 2022-01-13 DIAGNOSIS — G8929 Other chronic pain: Secondary | ICD-10-CM | POA: Diagnosis not present

## 2022-01-13 NOTE — Progress Notes (Signed)
Subjective:    Patient ID: Samuel Davis. Samuel Davis, male    DOB: 31-Aug-1970, 51 y.o.   MRN: [id number]  Willliam Davis. Samuel Davis is a 51 y.o. male presenting on 01/13/2022 for FMLA paperwork and Shoulder Pain   HPI  Right Shoulder, chronic tendonitis / osteoarthritis  Updates today since last visit for FMLA renewal, 08/12/21  Reports chronic issue with episodic flares R shoulder pain. He has seen the [facility] specialist back in 12/2020 and had X-rays and diagnosed with arthritis and bursitis, symptoms worse at night, difficulty laying on R shoulder. Worse with repetitive activity. He has tried NSAID oral PRN with some temporary relief but decided to avoid nsaid longer term. - He was given some home exercise regimen and PT in the past  He does work involving reaching above shoulder for work, often lifting and reaching from that height. Can lift up to 50 lbs at work.   Has not had traumatic injury on R shoulder. No prior injection or surgery.  Currently he has done well on medication Baclofen PRN and exercise therapy for shoulder.  He has had episodic flares that interfere with his ability to lift or reach overhead and keep him out of work. He has benefit from [facility] intermittent absence  He will continue to follow up with Orthopedics in future if not improving.     08/23/2021   10:30 AM 08/23/2021    9:46 AM 01/27/2021    8:20 AM  Depression screen PHQ 2/9  Decreased Interest 0 0 0  Down, Depressed, Hopeless 0 0 0  PHQ - 2 Score 0 0 0  Altered sleeping 0 0 0  Tired, decreased energy 0 0 0  Change in appetite 0 0 0  Feeling bad or failure about yourself  0 0 0  Trouble concentrating 0 0 0  Moving slowly or fidgety/restless 0 0 0  Suicidal thoughts 0 0 0  PHQ-9 Score 0 0 0  Difficult doing work/chores Not difficult at all Not difficult at all Not difficult at all    Social History   Tobacco Use   Smokeless tobacco: Former   Tobacco comments:    Quit cold [place]  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  Constitutional:      General: He is not in acute distress.    Appearance: He is well-developed. He is not diaphoretic.     Comments: Well-appearing, comfortable, cooperative  HENT:     Head: Normocephalic and atraumatic.  Eyes:     General:        Right eye: No discharge.        Left eye: No discharge.     Conjunctiva/sclera: Conjunctivae normal.  Neck:  Thyroid: No thyromegaly.  Cardiovascular:     Rate and Rhythm: Normal rate and regular rhythm.     Pulses: Normal pulses.     Heart sounds: Normal heart sounds. No murmur heard. Pulmonary:     Effort: Pulmonary effort is normal. No respiratory distress.     Breath sounds: Normal breath sounds. No wheezing or rales.  Musculoskeletal:     Cervical back: Normal range of motion and neck supple.     Comments: RIGHT Shoulder Inspection: Normal appearance bilateral symmetrical Palpation: Non-tender to palpation over anterior, lateral, or posterior shoulder  ROM: Improved but still reduced active ROM abduction limited above shoulder level, forward flexion intact. Some reduced internal rotation still. Special Testing: Rotator cuff testing negative for weakness with supraspinatus full can and empty can test, Impingement testing POSITIVE R shoulder provoked symptoms. Strength: Normal strength 5/5 flex/ext, ext rot / int rot, grip, rotator cuff str  testing. Neurovascular: Distally intact pulses, sensation to light touch  Lymphadenopathy:     Cervical: No cervical adenopathy.  Skin:    General: Skin is warm and dry.     Findings: No erythema or rash.  Neurological:     Mental Status: He is alert and oriented to person, place, and time. Mental status is at baseline.  Psychiatric:        Behavior: Behavior normal.     Comments: Well groomed, good eye contact, normal speech and thoughts    Results for orders placed or performed in visit on 01/27/21  COMPLETE METABOLIC PANEL WITH GFR  Result Value Ref Range   Glucose, Bld 108 65 - 139 mg/dL   BUN 19 7 - 25 mg/dL   Creat 1.33 (H) 0.60 - 1.29 mg/dL   eGFR 66 > OR = 60 mL/min/1.34m   BUN/Creatinine Ratio 14 6 - 22 (calc)   Sodium 141 135 - 146 mmol/L   Potassium 4.5 3.5 - 5.3 mmol/L   Chloride 106 98 - 110 mmol/L   CO2 27 20 - 32 mmol/L   Calcium 9.3 8.6 - 10.3 mg/dL   Total Protein 6.6 6.1 - 8.1 g/dL   Albumin 4.3 3.6 - 5.1 g/dL   Globulin 2.3 1.9 - 3.7 g/dL (calc)   AG Ratio 1.9 1.0 - 2.5 (calc)   Total Bilirubin 0.5 0.2 - 1.2 mg/dL   Alkaline phosphatase (APISO) 38 36 - 130 U/L   AST 31 10 - 40 U/L   ALT 37 9 - 46 U/L  Lipid panel  Result Value Ref Range   Cholesterol 223 (H) <200 mg/dL   HDL 85 > OR = 40 mg/dL   Triglycerides 63 <150 mg/dL   LDL Cholesterol (Calc) 123 (H) mg/dL (calc)   Total CHOL/HDL Ratio 2.6 <5.0 (calc)   Non-HDL Cholesterol (Calc) 138 (H) <130 mg/dL (calc)  CBC with Differential/Platelet  Result Value Ref Range   WBC 3.9 3.8 - 10.8 Thousand/uL   RBC 5.60 4.20 - 5.80 Million/uL   Hemoglobin 12.9 (L) 13.2 - 17.1 g/dL   HCT 42.4 38.5 - 50.0 %   MCV 75.7 (L) 80.0 - 100.0 fL   MCH 23.0 (L) 27.0 - 33.0 pg   MCHC 30.4 (L) 32.0 - 36.0 g/dL   RDW 14.9 11.0 - 15.0 %   Platelets 180 140 - 400 Thousand/uL   MPV 11.2 7.5 - 12.5 fL   Neutro Abs 2,165 1,500 - 7,800 cells/uL   Lymphs Abs 1,135 850 - 3,900 cells/uL   Absolute Monocytes 371 200 - 950  cells/uL   Eosinophils Absolute 191 15 - 500 cells/uL   Basophils Absolute 39 0 - 200 cells/uL   Neutrophils Relative % 55.5 %   Total Lymphocyte 29.1 %   Monocytes Relative 9.5 %   Eosinophils Relative 4.9 %   Basophils Relative 1.0 %  Hemoglobin A1c  Result Value Ref Range   Hgb A1c MFr Bld 5.6 <5.7 % of total Hgb   Mean Plasma Glucose 114 mg/dL   eAG (mmol/L) 6.3 mmol/L  PSA  Result Value Ref Range   PSA 0.50 < OR = 4.00 ng/mL  Hepatitis C antibody  Result Value Ref Range   Hepatitis C Ab NON-REACTIVE NON-REACTIVE   SIGNAL TO CUT-OFF 0.04 <1.00  TSH  Result Value Ref Range   TSH 0.60 0.40 - 4.50 mIU/L      Assessment & Plan:   Problem List Items Addressed This Visit   None Visit Diagnoses     Chronic bursitis of right shoulder    -  Primary   Chronic right shoulder pain       Needs flu shot       Relevant Orders   Flu Vaccine QUAD 6+ mos PF IM (Fluarix Quad PF) (Completed)       Consistent with RIGHT-shoulder bursitis vs rotator cuff tendinopathy with some reduced active ROM but without significant evidence of muscle tear (no weakness).  Known repetitive overhead/strenuous activity as likely etiology  51 yr old patient with likely underlying arthritis - Imaging per Orthopedics ([facility] 12/2020)   Plan: 1. Continue anti inflammatory topical - OTC Voltaren (generic Diclofenac) topical 2-4 times a day as needed for pain swelling of affected joint for 1-2 weeks or longer. - Cannot tolerate oral NSAIDs 2. Continue Baclofen muscle relaxant 5-56m QHS PRN caution sedation 3. May take Tylenol Ex Str 1-2 q 6 hr PRN 4. Relative rest but keep shoulder mobile, demonstrated ROM exercises, avoid heavy lifting 5. May try heating pad PRN   Discussion today on future follow-up with Orthopedics considering cortisone injection in joint / guided Physical Therapy or other procedural intervention.   Updated FMLA documentation today. Change form, new information submitted as  below:   FMLA Intermittent Requested date 01/13/22  Work Accommodations = FMLA Intermittent leave only. No work restrictions given today. When not having flare up, he can perform all tasks.  Intermittent only Leave for medical apt 2 appointments visit q 6 month for 1 day   Frequency 3 flares per month, duration 1 days per episode   Anticipate duration 12+ months, may require renewal yearly or unless problem resolves   Will complete paperwork today and [facility]   No orders of the defined types were placed in this encounter.     Follow up plan: Return if symptoms worsen or fail to improve, for Follow-up 6-12 month FMLA Shoulder.   ANobie Putnam [facility] 01/13/2022, 11:21 AM

## 2022-01-13 NOTE — Patient Instructions (Addendum)
Thank you for coming to the office today.    Please schedule a Follow-up Appointment to: Return if symptoms worsen or fail to improve, for Follow-up 6-12 month FMLA Shoulder.  If you have any other questions or concerns, please feel free to call the office or send a message through [facility]. You may also schedule an earlier appointment if necessary.  Additionally, you may be receiving a survey about your experience at our office within a few days to 1 week by e-mail or mail. We value your feedback.  Nobie Putnam, DO [place]

## 2022-01-16 ENCOUNTER — Ambulatory Visit: Payer: BC Managed Care – PPO | Admitting: Family Medicine

## 2022-02-02 ENCOUNTER — Encounter: Payer: BC Managed Care – PPO | Admitting: Family Medicine

## 2022-02-23 ENCOUNTER — Other Ambulatory Visit: Payer: Self-pay | Admitting: Family Medicine

## 2022-02-23 ENCOUNTER — Encounter: Payer: Self-pay | Admitting: Family Medicine

## 2022-02-23 ENCOUNTER — Ambulatory Visit: Payer: BC Managed Care – PPO | Admitting: Family Medicine

## 2022-02-23 VITALS — BP 151/99 | HR 70 | Temp 97.1°F | Ht 74.0 in | Wt 230.0 lb

## 2022-02-23 DIAGNOSIS — M25511 Pain in right shoulder: Secondary | ICD-10-CM | POA: Diagnosis not present

## 2022-02-23 DIAGNOSIS — Z125 Encounter for screening for malignant neoplasm of prostate: Secondary | ICD-10-CM

## 2022-02-23 DIAGNOSIS — D509 Iron deficiency anemia, unspecified: Secondary | ICD-10-CM

## 2022-02-23 DIAGNOSIS — M7551 Bursitis of right shoulder: Secondary | ICD-10-CM | POA: Diagnosis not present

## 2022-02-23 DIAGNOSIS — G8929 Other chronic pain: Secondary | ICD-10-CM | POA: Diagnosis not present

## 2022-02-23 DIAGNOSIS — R7309 Other abnormal glucose: Secondary | ICD-10-CM

## 2022-02-23 DIAGNOSIS — R030 Elevated blood-pressure reading, without diagnosis of hypertension: Secondary | ICD-10-CM

## 2022-02-23 DIAGNOSIS — Z0000 Encounter for general adult medical examination without abnormal findings: Secondary | ICD-10-CM

## 2022-02-23 DIAGNOSIS — E7800 Pure hypercholesterolemia, unspecified: Secondary | ICD-10-CM

## 2022-02-23 DIAGNOSIS — R7989 Other specified abnormal findings of blood chemistry: Secondary | ICD-10-CM

## 2022-02-23 NOTE — Progress Notes (Signed)
Subjective:    Patient ID: Samuel Davis. Quinter, male    DOB: Mar 20, 1971, 51 y.o.   MRN: [id number]  Samuel Davis. Borgwardt is a 51 y.o. male presenting on 02/23/2022 for Shoulder Pain   HPI  Right Shoulder, chronic tendonitis / osteoarthritis   Updates today with follow up to adjust FMLA terms. Last renewal submitted 01/13/22   Reports chronic issue with episodic flares R shoulder pain. He has seen the [facility] specialist back in 12/2020 and had X-rays and diagnosed with arthritis and bursitis, symptoms worse at night, difficulty laying on R shoulder. Worse with repetitive activity. He has tried NSAID oral PRN with some temporary relief but decided to avoid nsaid longer term. - He was given some home exercise regimen and PT in the past   He does work involving reaching above shoulder for work, often lifting and reaching from that height. Can lift up to 50 lbs at work.   Has not had traumatic injury on R shoulder. No prior injection or surgery.   Currently he has done well on medication Baclofen PRN and exercise therapy for shoulder.   He has had episodic flares that interfere with his ability to lift or reach overhead and keep him out of work. He has benefit from [facility] intermittent absence  He is having increased frequency of shoulder pain flare up that will require additional time out of work. Based on the chronic recurrent nature of this problem he has had more frequent flares.   He will continue to follow up with Orthopedics in future if not improving.       02/23/2022    3:20 PM 08/23/2021   10:30 AM 08/23/2021    9:46 AM  Depression screen PHQ 2/9  Decreased Interest 0 0 0  Down, Depressed, Hopeless 0 0 0  PHQ - 2 Score 0 0 0  Altered sleeping 0 0 0  Tired, decreased energy 0 0 0  Change in appetite 0 0 0  Feeling bad or failure about yourself  0 0 0  Trouble concentrating 0 0 0  Moving slowly or fidgety/restless 0 0 0  Suicidal thoughts 0 0 0  PHQ-9 Score 0 0 0   Difficult doing work/chores Not difficult at all Not difficult at all Not difficult at all    Social History   Tobacco Use   Smoking status: Former    Packs/day: 0.75    Years: 10.00    Total pack years: 7.50    Types: Cigarettes    Quit date: 09/08/2017    Years since quitting: 4.4   Smokeless tobacco: Former   Tobacco comments:    Quit cold [place]  Scientific laboratory technician Use: Never used  Substance Use Topics   Alcohol use: Not Currently    Alcohol/week: 7.0 standard drinks of alcohol    Types: 7 Cans of beer per week   Drug use: Never    Review of Systems Per HPI unless specifically indicated above     Objective:    BP (!) 151/99 (BP Location: Left Arm, Patient Position: Sitting, Cuff Size: Normal)   Pulse 70   Temp (!) 97.1 F (36.2 C) (Temporal)   Ht _0  (1.88 m)   Wt 230 lb (104.3 kg)   SpO2 98%   BMI 29.53 kg/m   Wt Readings from Last 3 Encounters:  02/23/22 230 lb (104.3 kg)  01/13/22 223 lb 8 oz (101.4 kg)  09/16/21 223 lb 12.8 oz (101.5 kg)  Physical Exam Vitals and nursing note reviewed.  Constitutional:      General: He is not in acute distress.    Appearance: He is well-developed. He is not diaphoretic.     Comments: Well-appearing, comfortable, cooperative  HENT:     Head: Normocephalic and atraumatic.  Eyes:     General:        Right eye: No discharge.        Left eye: No discharge.     Conjunctiva/sclera: Conjunctivae normal.  Neck:     Thyroid: No thyromegaly.  Cardiovascular:     Rate and Rhythm: Normal rate and regular rhythm.     Pulses: Normal pulses.     Heart sounds: Normal heart sounds. No murmur heard. Pulmonary:     Effort: Pulmonary effort is normal. No respiratory distress.     Breath sounds: Normal breath sounds. No wheezing or rales.  Musculoskeletal:     Cervical back: Normal range of motion and neck supple.     Comments: RIGHT Shoulder Inspection: Normal appearance bilateral symmetrical Palpation: Non-tender to  palpation over anterior, lateral, or posterior shoulder  ROM: Improved but still reduced active ROM abduction limited above shoulder level, forward flexion intact. Some reduced internal rotation still. Special Testing: Rotator cuff testing negative for weakness with supraspinatus full can and empty can test, Impingement testing POSITIVE R shoulder provoked symptoms. Strength: Normal strength 5/5 flex/ext, ext rot / int rot, grip, rotator cuff str testing. Neurovascular: Distally intact pulses, sensation to light touch  Lymphadenopathy:     Cervical: No cervical adenopathy.  Skin:    General: Skin is warm and dry.     Findings: No erythema or rash.  Neurological:     Mental Status: He is alert and oriented to person, place, and time. Mental status is at baseline.  Psychiatric:        Behavior: Behavior normal.     Comments: Well groomed, good eye contact, normal speech and thoughts       Results for orders placed or performed in visit on 01/27/21  COMPLETE METABOLIC PANEL WITH GFR  Result Value Ref Range   Glucose, Bld 108 65 - 139 mg/dL   BUN 19 7 - 25 mg/dL   Creat 1.33 (H) 0.60 - 1.29 mg/dL   eGFR 66 > OR = 60 mL/min/1.23m   BUN/Creatinine Ratio 14 6 - 22 (calc)   Sodium 141 135 - 146 mmol/L   Potassium 4.5 3.5 - 5.3 mmol/L   Chloride 106 98 - 110 mmol/L   CO2 27 20 - 32 mmol/L   Calcium 9.3 8.6 - 10.3 mg/dL   Total Protein 6.6 6.1 - 8.1 g/dL   Albumin 4.3 3.6 - 5.1 g/dL   Globulin 2.3 1.9 - 3.7 g/dL (calc)   AG Ratio 1.9 1.0 - 2.5 (calc)   Total Bilirubin 0.5 0.2 - 1.2 mg/dL   Alkaline phosphatase (APISO) 38 36 - 130 U/L   AST 31 10 - 40 U/L   ALT 37 9 - 46 U/L  Lipid panel  Result Value Ref Range   Cholesterol 223 (H) <200 mg/dL   HDL 85 > OR = 40 mg/dL   Triglycerides 63 <150 mg/dL   LDL Cholesterol (Calc) 123 (H) mg/dL (calc)   Total CHOL/HDL Ratio 2.6 <5.0 (calc)   Non-HDL Cholesterol (Calc) 138 (H) <130 mg/dL (calc)  CBC with Differential/Platelet  Result  Value Ref Range   WBC 3.9 3.8 - 10.8 Thousand/uL   RBC 5.60 4.20 -  5.80 Million/uL   Hemoglobin 12.9 (L) 13.2 - 17.1 g/dL   HCT 42.4 38.5 - 50.0 %   MCV 75.7 (L) 80.0 - 100.0 fL   MCH 23.0 (L) 27.0 - 33.0 pg   MCHC 30.4 (L) 32.0 - 36.0 g/dL   RDW 14.9 11.0 - 15.0 %   Platelets 180 140 - 400 Thousand/uL   MPV 11.2 7.5 - 12.5 fL   Neutro Abs 2,165 1,500 - 7,800 cells/uL   Lymphs Abs 1,135 850 - 3,900 cells/uL   Absolute Monocytes 371 200 - 950 cells/uL   Eosinophils Absolute 191 15 - 500 cells/uL   Basophils Absolute 39 0 - 200 cells/uL   Neutrophils Relative % 55.5 %   Total Lymphocyte 29.1 %   Monocytes Relative 9.5 %   Eosinophils Relative 4.9 %   Basophils Relative 1.0 %  Hemoglobin A1c  Result Value Ref Range   Hgb A1c MFr Bld 5.6 <5.7 % of total Hgb   Mean Plasma Glucose 114 mg/dL   eAG (mmol/L) 6.3 mmol/L  PSA  Result Value Ref Range   PSA 0.50 < OR = 4.00 ng/mL  Hepatitis C antibody  Result Value Ref Range   Hepatitis C Ab NON-REACTIVE NON-REACTIVE   SIGNAL TO CUT-OFF 0.04 <1.00  TSH  Result Value Ref Range   TSH 0.60 0.40 - 4.50 mIU/L      Assessment & Plan:   Problem List Items Addressed This Visit   None Visit Diagnoses     Chronic bursitis of right shoulder    -  Primary   Chronic right shoulder pain           No orders of the defined types were placed in this encounter.  Consistent with RIGHT-shoulder bursitis vs rotator cuff tendinopathy with some reduced active ROM but without significant evidence of muscle tear (no weakness).  Known repetitive overhead/strenuous activity as likely etiology  51 yr old patient with likely underlying arthritis - Imaging per Orthopedics ([facility] 12/2020)  Increased flare up frequency with his shoulder bursitis, has required him to miss additional time from work to take treatment.    Plan: 1. Continue anti inflammatory topical - OTC Voltaren (generic Diclofenac) topical 2-4 times a day as needed for pain  swelling of affected joint for 1-2 weeks or longer. - Cannot tolerate oral NSAIDs 2. Continue Baclofen muscle relaxant 5-29m QHS PRN caution sedation 3. May take Tylenol Ex Str 1-2 q 6 hr PRN 4. Relative rest but keep shoulder mobile, demonstrated ROM exercises, avoid heavy lifting 5. May try heating pad PRN   Discussion today on future follow-up with Orthopedics considering cortisone injection in joint / guided Physical Therapy or other procedural intervention.   Updated FMLA documentation today. 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 No work restrictions given today. When not having flare up, he can perform all tasks.   Intermittent only Leave for medical apt 2 appointments visit q 6 month for 1 day   INCREASED Frequency from 3 now up to 5 flares per month, duration 1 days per episode   Anticipate duration 12+ months, may require renewal yearly or unless problem resolves   [facility] complete [facility] today [facility] and to be mailed to patient    Follow up plan: Return in about 5 days (around 02/28/2022) for 11/21 Tues 10am for fasting lab only and 11/21 Tues 320pm for  Physical (rescheduled).  Future labs ordered for Tues 11/21 10am   Nobie Putnam, [facility] 02/23/2022, 3:29 PM

## 2022-02-23 NOTE — Patient Instructions (Addendum)
Thank you for coming to the office today.  Will complete FMLA paperwork today  Increase number of episodes from 3 to 5, per month.  DUE for FASTING BLOOD WORK (no food or drink after midnight before the lab appointment, only water or coffee without cream/sugar on the morning of)  SCHEDULE "Lab Only" visit in the morning at the clinic for lab draw on Tuesday 11/21 at 10am  - Make sure Lab Only appointment is at about 1 week before your next appointment, so that results will be available  For Lab Results, once available within 2-3 days of blood draw, you can can log in to MyChart online to view your results and a brief explanation. Also, we can discuss results at next follow-up visit.    Please schedule a Follow-up Appointment to: Return in about 5 days (around 02/28/2022) for 11/21 Tues 10am for fasting lab only and 11/21 Tues 320pm for Physical (rescheduled).  If you have any other questions or concerns, please feel free to call the office or send a message through [place]. You may also schedule an earlier appointment if necessary.  Additionally, you may be receiving a survey about your experience at our office within a few days to 1 week by e-mail or mail. We value your feedback.  Nobie Putnam, DO [place]

## 2022-02-28 ENCOUNTER — Other Ambulatory Visit: Payer: BC Managed Care – PPO

## 2022-02-28 ENCOUNTER — Encounter: Payer: BC Managed Care – PPO | Admitting: Family Medicine

## 2022-03-09 ENCOUNTER — Other Ambulatory Visit: Payer: Self-pay

## 2022-03-09 DIAGNOSIS — Z0000 Encounter for general adult medical examination without abnormal findings: Secondary | ICD-10-CM

## 2022-03-09 DIAGNOSIS — D509 Iron deficiency anemia, unspecified: Secondary | ICD-10-CM

## 2022-03-09 DIAGNOSIS — E7800 Pure hypercholesterolemia, unspecified: Secondary | ICD-10-CM

## 2022-03-09 DIAGNOSIS — R7989 Other specified abnormal findings of blood chemistry: Secondary | ICD-10-CM

## 2022-03-09 DIAGNOSIS — Z125 Encounter for screening for malignant neoplasm of prostate: Secondary | ICD-10-CM

## 2022-03-09 DIAGNOSIS — R7309 Other abnormal glucose: Secondary | ICD-10-CM

## 2022-03-09 DIAGNOSIS — R030 Elevated blood-pressure reading, without diagnosis of hypertension: Secondary | ICD-10-CM

## 2022-03-10 ENCOUNTER — Other Ambulatory Visit: Payer: BC Managed Care – PPO

## 2022-03-16 DIAGNOSIS — H1031 Unspecified acute conjunctivitis, right eye: Secondary | ICD-10-CM | POA: Diagnosis not present

## 2022-04-12 ENCOUNTER — Encounter: Payer: BC Managed Care – PPO | Admitting: Family Medicine

## 2022-06-26 ENCOUNTER — Ambulatory Visit: Payer: BC Managed Care – PPO | Admitting: Family Medicine

## 2022-06-26 ENCOUNTER — Encounter: Payer: Self-pay | Admitting: Family Medicine

## 2022-06-26 VITALS — BP 144/92 | HR 66 | Temp 97.1°F | Wt 220.0 lb

## 2022-06-26 DIAGNOSIS — G8929 Other chronic pain: Secondary | ICD-10-CM

## 2022-06-26 DIAGNOSIS — M25511 Pain in right shoulder: Secondary | ICD-10-CM | POA: Diagnosis not present

## 2022-06-26 DIAGNOSIS — M7551 Bursitis of right shoulder: Secondary | ICD-10-CM

## 2022-06-26 NOTE — Progress Notes (Signed)
Subjective:    Patient ID: Samuel Davis. Haser, male    DOB: Nov 02, 1970, 52 y.o.   MRN: [id number]  Samuel Davis. Burr is a 53 y.o. male presenting on 06/26/2022 for Shoulder Pain   HPI  Right Shoulder, chronic tendonitis / osteoarthritis   Updates today with follow up to adjust FMLA terms. Last renewal updated 02/23/22   Reports chronic issue with episodic flares R shoulder pain. He has seen the [facility] specialist back in 12/2020 and had X-rays and diagnosed with arthritis and bursitis, symptoms worse at night, difficulty laying on R shoulder. Worse with repetitive activity. He has tried NSAID oral PRN with some temporary relief but decided to avoid nsaid longer term. - He was given some home exercise regimen and PT in the past   He does work involving reaching above shoulder for work, often lifting and reaching from that height. Can lift up to 50 lbs at work.   Has not had traumatic injury on R shoulder. No prior injection or surgery.   Currently he has done well on medication Baclofen PRN and exercise therapy for shoulder.   He has had episodic flares that interfere with his ability to lift or reach overhead and keep him out of work. He has benefit from [facility] intermittent absence due to flare ups and doctors apt.  When he is not having flare he is able to work. But if flared up, shoulder prevents him from working. He is having increased frequency of shoulder pain flare up that will require additional time out of work in future. Based on the chronic recurrent nature of this problem he has had more frequent flares.   He will continue to follow up with Orthopedics in future if not improving.      06/26/2022   10:10 AM 02/23/2022    3:20 PM 08/23/2021   10:30 AM  Depression screen PHQ 2/9  Decreased Interest 1 0 0  Down, Depressed, Hopeless 1 0 0  PHQ - 2 Score 2 0 0  Altered sleeping 1 0 0  Tired, decreased energy 1 0 0  Change in appetite 1 0 0  Feeling bad or failure about  yourself  0 0 0  Trouble concentrating 0 0 0  Moving slowly or fidgety/restless 0 0 0  Suicidal thoughts 0 0 0  PHQ-9 Score 5 0 0  Difficult doing work/chores Not difficult at all Not difficult at all Not difficult at all    Social History   Tobacco Use   Smoking status: Former    Packs/day: 0.75    Years: 10.00    Additional pack years: 0.00    Total pack years: 7.50    Types: Cigarettes    Quit date: 09/08/2017    Years since quitting: 4.8   Smokeless tobacco: Former   Tobacco comments:    Quit cold [place]  Scientific laboratory technician Use: Never used  Substance Use Topics   Alcohol use: Not Currently    Alcohol/week: 7.0 standard drinks of alcohol    Types: 7 Cans of beer per week   Drug use: Never    Review of Systems Per HPI unless specifically indicated above     Objective:    BP (!) 144/92 (BP Location: Left Arm, Patient Position: Sitting, Cuff Size: Large)   Pulse 66   Temp (!) 97.1 F (36.2 C) (Temporal)   Wt 220 lb (99.8 kg)   SpO2 99%   BMI 28.25 kg/m   Wt  Readings from Last 3 Encounters:  06/26/22 220 lb (99.8 kg)  02/23/22 230 lb (104.3 kg)  01/13/22 223 lb 8 oz (101.4 kg)    Physical Exam Vitals and nursing note reviewed.  Constitutional:      General: He is not in acute distress.    Appearance: He is well-developed. He is not diaphoretic.     Comments: Well-appearing, comfortable, cooperative  HENT:     Head: Normocephalic and atraumatic.  Eyes:     General:        Right eye: No discharge.        Left eye: No discharge.     Conjunctiva/sclera: Conjunctivae normal.  Neck:     Thyroid: No thyromegaly.  Cardiovascular:     Rate and Rhythm: Normal rate and regular rhythm.     Pulses: Normal pulses.     Heart sounds: Normal heart sounds. No murmur heard. Pulmonary:     Effort: Pulmonary effort is normal. No respiratory distress.     Breath sounds: Normal breath sounds. No wheezing or rales.  Musculoskeletal:     Cervical back: Normal range of  motion and neck supple.     Comments: RIGHT Shoulder Inspection: Normal appearance bilateral symmetrical Palpation: Non-tender to palpation over anterior, lateral, or posterior shoulder  ROM: Improved but still reduced active ROM abduction limited above shoulder level, forward flexion intact. Some reduced internal rotation still. Special Testing: Rotator cuff testing negative for weakness with supraspinatus full can and empty can test, Impingement testing POSITIVE R shoulder provoked symptoms. Strength: Normal strength 5/5 flex/ext, ext rot / int rot, grip, rotator cuff str testing. Neurovascular: Distally intact pulses, sensation to light touch  Lymphadenopathy:     Cervical: No cervical adenopathy.  Skin:    General: Skin is warm and dry.     Findings: No erythema or rash.  Neurological:     Mental Status: He is alert and oriented to person, place, and time. Mental status is at baseline.  Psychiatric:        Behavior: Behavior normal.     Comments: Well groomed, good eye contact, normal speech and thoughts      Results for orders placed or performed in visit on 01/27/21  COMPLETE METABOLIC PANEL WITH GFR  Result Value Ref Range   Glucose, Bld 108 65 - 139 mg/dL   BUN 19 7 - 25 mg/dL   Creat 1.33 (H) 0.60 - 1.29 mg/dL   eGFR 66 > OR = 60 mL/min/1.76m2   BUN/Creatinine Ratio 14 6 - 22 (calc)   Sodium 141 135 - 146 mmol/L   Potassium 4.5 3.5 - 5.3 mmol/L   Chloride 106 98 - 110 mmol/L   CO2 27 20 - 32 mmol/L   Calcium 9.3 8.6 - 10.3 mg/dL   Total Protein 6.6 6.1 - 8.1 g/dL   Albumin 4.3 3.6 - 5.1 g/dL   Globulin 2.3 1.9 - 3.7 g/dL (calc)   AG Ratio 1.9 1.0 - 2.5 (calc)   Total Bilirubin 0.5 0.2 - 1.2 mg/dL   Alkaline phosphatase (APISO) 38 36 - 130 U/L   AST 31 10 - 40 U/L   ALT 37 9 - 46 U/L  Lipid panel  Result Value Ref Range   Cholesterol 223 (H) <200 mg/dL   HDL 85 > OR = 40 mg/dL   Triglycerides 63 <150 mg/dL   LDL Cholesterol (Calc) 123 (H) mg/dL (calc)   Total  CHOL/HDL Ratio 2.6 <5.0 (calc)   Non-HDL Cholesterol (Calc) 138 (  H) <130 mg/dL (calc)  CBC with Differential/Platelet  Result Value Ref Range   WBC 3.9 3.8 - 10.8 Thousand/uL   RBC 5.60 4.20 - 5.80 Million/uL   Hemoglobin 12.9 (L) 13.2 - 17.1 g/dL   HCT 42.4 38.5 - 50.0 %   MCV 75.7 (L) 80.0 - 100.0 fL   MCH 23.0 (L) 27.0 - 33.0 pg   MCHC 30.4 (L) 32.0 - 36.0 g/dL   RDW 14.9 11.0 - 15.0 %   Platelets 180 140 - 400 Thousand/uL   MPV 11.2 7.5 - 12.5 fL   Neutro Abs 2,165 1,500 - 7,800 cells/uL   Lymphs Abs 1,135 850 - 3,900 cells/uL   Absolute Monocytes 371 200 - 950 cells/uL   Eosinophils Absolute 191 15 - 500 cells/uL   Basophils Absolute 39 0 - 200 cells/uL   Neutrophils Relative % 55.5 %   Total Lymphocyte 29.1 %   Monocytes Relative 9.5 %   Eosinophils Relative 4.9 %   Basophils Relative 1.0 %  Hemoglobin A1c  Result Value Ref Range   Hgb A1c MFr Bld 5.6 <5.7 % of total Hgb   Mean Plasma Glucose 114 mg/dL   eAG (mmol/L) 6.3 mmol/L  PSA  Result Value Ref Range   PSA 0.50 < OR = 4.00 ng/mL  Hepatitis C antibody  Result Value Ref Range   Hepatitis C Ab NON-REACTIVE NON-REACTIVE   SIGNAL TO CUT-OFF 0.04 <1.00  TSH  Result Value Ref Range   TSH 0.60 0.40 - 4.50 mIU/L      Assessment & Plan:   Problem List Items Addressed This Visit   None Visit Diagnoses     Chronic bursitis of right shoulder    -  Primary   Chronic right shoulder pain           Consistent with RIGHT-shoulder bursitis vs rotator cuff tendinopathy with some reduced active ROM but without significant evidence of muscle tear (no weakness).  Known repetitive overhead/strenuous activity as likely etiology  52 yr old patient with likely underlying arthritis - Imaging per Orthopedics ([facility] 12/2020)   Increased flare up frequency with his shoulder bursitis, has required him to miss additional time from work to take treatments and therapy.     Plan: 1. Continue anti inflammatory topical - OTC  Voltaren (generic Diclofenac) topical 2-4 times a day as needed for pain swelling of affected joint for 1-2 weeks or longer. - Cannot tolerate oral NSAIDs 2. Continue Baclofen muscle relaxant 5-10mg  QHS PRN caution sedation 3. May take Tylenol Ex Str 1-2 q 6 hr PRN 4. Relative rest but keep shoulder mobile, demonstrated ROM exercises, avoid heavy lifting 5. May try heating pad PRN   Discussion today on future follow-up with Orthopedics considering cortisone injection in joint / guided Physical Therapy or other procedural intervention if not improving.   Updated FMLA documentation again today. 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 No work restrictions given today. When not having flare up, he can perform all tasks.   Intermittent only Leave for medical apt 2 appointments visit q 6 month for 1 day   REDUCED Frequency from 5 flares to 4 flares per month INCREASED Duration of flares from 1 day to 2 days now per episode   Anticipate duration 12+ months, may require renewal yearly or unless problem resolves   Will complete paperwork today and it can be faxed to [facility] 06/27/22, and to  be mailed to patient   No orders of the defined types were placed in this encounter.     Follow up plan: Return in about 2 months (around 08/26/2022) for 2-3 months fasting lab only then 1 week later Annual Physical.    Nobie Putnam, DO [facility] 06/26/2022, 9:22 AM

## 2022-06-26 NOTE — Patient Instructions (Addendum)
Thank you for coming to the office today.  Changing Intermittent FMLA paperwork to now request 4 flares per month instead of 5. And increase days per flare from 1 to 2 days per flare.  Will submit paperwork starting 07/05/22 through 07/05/23  DUE for FASTING BLOOD WORK (no food or drink after midnight before the lab appointment, only water or coffee without cream/sugar on the morning of)  SCHEDULE "Lab Only" visit in the morning at the clinic for lab draw in 2 to 3 MONTHS   - Make sure Lab Only appointment is at about 1 week before your next appointment, so that results will be available  For Lab Results, once available within 2-3 days of blood draw, you can can log in to MyChart online to view your results and a brief explanation. Also, we can discuss results at next follow-up visit.   Please schedule a Follow-up Appointment to: Return in about 2 months (around 08/26/2022) for 2-3 months fasting lab only then 1 week later Annual Physical.  If you have any other questions or concerns, please feel free to call the office or send a message through [place]. You may also schedule an earlier appointment if necessary.  Additionally, you may be receiving a survey about your experience at our office within a few days to 1 week by e-mail or mail. We value your feedback.  Nobie Putnam, DO [facility]

## 2022-10-16 ENCOUNTER — Emergency Department
Admission: EM | Admit: 2022-10-16 | Discharge: 2022-10-16 | Discharge status: Against Medical Advice | Payer: BC Managed Care – PPO | Attending: Emergency Medicine | Admitting: Emergency Medicine

## 2022-10-16 ENCOUNTER — Other Ambulatory Visit: Payer: Self-pay

## 2022-10-16 DIAGNOSIS — Z5321 Procedure and treatment not carried out due to patient leaving prior to being seen by health care provider: Secondary | ICD-10-CM | POA: Insufficient documentation

## 2022-10-16 DIAGNOSIS — R42 Dizziness and giddiness: Secondary | ICD-10-CM | POA: Diagnosis not present

## 2022-10-16 LAB — CBC
HCT: 43.9 % (ref 39.0–52.0)
Hemoglobin: 13.5 g/dL (ref 13.0–17.0)
MCH: 22.8 pg — ABNORMAL LOW (ref 26.0–34.0)
MCHC: 30.8 g/dL (ref 30.0–36.0)
MCV: 74.2 fL — ABNORMAL LOW (ref 80.0–100.0)
Platelets: 276 10*3/uL (ref 150–400)
RBC: 5.92 MIL/uL — ABNORMAL HIGH (ref 4.22–5.81)
RDW: 14.9 % (ref 11.5–15.5)
WBC: 8.5 10*3/uL (ref 4.0–10.5)
nRBC: 0 % (ref 0.0–0.2)

## 2022-10-16 LAB — BASIC METABOLIC PANEL
Anion gap: 8 (ref 5–15)
BUN: 13 mg/dL (ref 6–20)
CO2: 25 mmol/L (ref 22–32)
Calcium: 9.2 mg/dL (ref 8.9–10.3)
Chloride: 107 mmol/L (ref 98–111)
Creatinine, Ser: 1.53 mg/dL — ABNORMAL HIGH (ref 0.61–1.24)
GFR, Estimated: 55 mL/min — ABNORMAL LOW (ref >60)
Glucose, Bld: 131 mg/dL — ABNORMAL HIGH (ref 70–99)
Potassium: 3.6 mmol/L (ref 3.5–5.1)
Sodium: 140 mmol/L (ref 135–145)

## 2022-10-16 NOTE — ED Notes (Signed)
Pt up to first RN desk reporting that he is feeling better at this time and would like to go home d/t extended wait times in ER.  Pt's IV placed in RAC by ems removed by this writer and pt ambulated out of lobby without difficulty.

## 2022-10-16 NOTE — ED Provider Triage Note (Signed)
Emergency Medicine Provider Triage Evaluation Note  Samuel Davis, a 52 y.o. male  was evaluated in triage.  Pt complains of dizziness. 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  He is unsure of any syncopal episode.  He denies any acute distress at this time.  Review of Systems  Positive: Syncope/near-syncope, dizziness Negative: CP, SOB  Physical Exam  BP (!) 157/82 (BP Location: Right Arm)   Pulse 62   Temp 98 F (36.7 C) (Oral)   Resp 19   SpO2 100%  Gen:   Awake, no distress  NAD Resp:  Normal effort CTA MSK:   Moves extremities without difficulty  CVS:  RRR  Medical Decision Making  Medically screening exam initiated at 8:03 PM.  Appropriate orders placed.  Samuel Davis was informed that the remainder of the evaluation will be completed by another provider, this initial triage assessment does not replace that evaluation, and the importance of remaining in the ED until their evaluation is complete.  Patient to the ED for evaluation of syncope/near syncope while at work.   Samuel Hoard, PA-C 10/16/22 2005

## 2022-10-16 NOTE — ED Triage Notes (Signed)
Pt presents to ED with c/o of feeling dizzy. 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 Pt unsure of syncopal episode. NAD noted.

## 2022-10-17 ENCOUNTER — Telehealth: Payer: Self-pay

## 2022-10-17 NOTE — Transitions of Care (Post Inpatient/ED Visit) (Signed)
   10/17/2022  Name: Samuel Davis. Zumbro MRN: [id number] DOB: [birth year]  Today's TOC FU Call Status: Today's TOC FU Call Status:: Successful TOC FU Call Competed TOC FU Call Complete Date: 10/16/22  Transition Care Management Follow-up Telephone Call Date of Discharge: 10/16/22 Discharge Facility: [facility]) Type of Discharge: Emergency Department Reason for ED Visit: Other: (Dizziness) How have you been since you were released from the hospital?: Better Any questions or concerns?: No  Items Reviewed: Did you receive and understand the discharge instructions provided?: No (LWBS) Medications obtained,verified, and reconciled?: Yes (Medications Reviewed) Any new allergies since your discharge?: No Dietary orders reviewed?: No Do you have support at home?: Yes  Medications Reviewed Today: Medications Reviewed Today     Reviewed by Smitty Cords, DO (Physician) on 06/26/22 at 1818  Med List Status: <None>   Medication Order Taking? Sig Documenting Provider Last Dose Status Informant  baclofen (LIORESAL) 10 MG tablet [id number] Yes Take 0.5-1 tablets (5-10 mg total) by mouth at bedtime as needed for muscle spasms. Smitty Cords, DO Taking Active   diclofenac Sodium (VOLTAREN) 1 % GEL [id number] Yes Apply 2 g topically 4 (four) times daily. Smitty Cords, DO Taking Active   sildenafil (REVATIO) 20 MG tablet [id number] Yes Take 1-5 pills about 30 min prior to sex. Start with 1 and increase as needed. Smitty Cords, DO Taking Active             Home Care and Equipment/Supplies: Were Home Health Services Ordered?: No Any new equipment or medical supplies ordered?: No  Functional Questionnaire: Do you need assistance with bathing/showering or dressing?: No Do you need assistance with meal preparation?: No Do you need assistance with eating?: No Do you have difficulty maintaining continence: No Do you need assistance  with getting out of bed/getting out of a chair/moving?: No Do you have difficulty managing or taking your medications?: No  Follow up appointments reviewed: PCP Follow-up appointment confirmed?: Yes Date of PCP follow-up appointment?: 10/18/22 Specialist Hospital Follow-up appointment confirmed?: No Do you need transportation to your follow-up appointment?: No Do you understand care options if your condition(s) worsen?: Yes-patient verbalized understanding    SIGNATURE  Kavin Leech, CMA

## 2022-10-18 ENCOUNTER — Ambulatory Visit: Payer: BC Managed Care – PPO | Admitting: Family Medicine

## 2022-10-18 ENCOUNTER — Encounter: Payer: Self-pay | Admitting: Family Medicine

## 2022-10-18 VITALS — BP 118/78 | HR 58 | Temp 98.6°F | Resp 17 | Ht 74.0 in | Wt 208.8 lb

## 2022-10-18 DIAGNOSIS — R55 Syncope and collapse: Secondary | ICD-10-CM | POA: Diagnosis not present

## 2022-10-18 DIAGNOSIS — E860 Dehydration: Secondary | ICD-10-CM

## 2022-10-18 NOTE — Progress Notes (Signed)
Subjective:    Patient ID: Samuel Davis, male    DOB: [birth year], 52 y.o.   MRN: [id number]  Samuel Davis is a 52 y.o. 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 After coming to he still felt extremely dizzy. He was seen in the ER x  10/16/2022. He come in today complaining of bodyaches )   HPI  Syncopal episode  Onset 7/8. Acute onset symptoms with syncopal episode. 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 He got dizzy suddenly, got to bathroom, felt unsteady, and sat down and fell out of chair passed out. EMT was called and evaluated him. They gave him IV fluid in ambulance and 1 IV in hospital. He felt lightheaded and unsteady after and also muscle ache bodyache sore. He returned to work yesterday but only felt 50% strength. Low blood pressure 91/45 and blood chemistry showed elevated Creatinine 1.53  Today he feels better but not back to baseline yet. He will improve hydration and electrolytes. He admits did not eat much that day this event happened.      10/18/2022    1:47 PM 06/26/2022   10:10 AM 02/23/2022    3:20 PM  Depression screen PHQ 2/9  Decreased Interest 0 1 0  Down, Depressed, Hopeless 0 1 0  PHQ - 2 Score 0 2 0  Altered sleeping 0 1 0  Tired, decreased energy 0 1 0  Change in appetite 0 1 0  Feeling bad or failure about yourself  0 0 0  Trouble concentrating 0 0 0  Moving slowly or fidgety/restless 0 0 0  Suicidal thoughts 0 0 0  PHQ-9 Score 0 5 0  Difficult doing work/chores Not difficult at all Not difficult at all Not difficult at all    Social History   Tobacco Use   Smoking status: Former    Packs/day: 0.75    Years: 10.00    Additional pack years: 0.00    Total pack years: 7.50    Types: Cigarettes    Quit date: 09/08/2017    Years since quitting: 5.1   Smokeless tobacco: Former   Tobacco comments:    Quit cold [place]   Building services engineer Use: Never used  Substance Use Topics   Alcohol use: Not Currently    Alcohol/week: 7.0 standard drinks of alcohol    Types: 7 Cans of beer per week   Drug use: Never    Review of Systems Per HPI unless specifically indicated above     Objective:    BP 118/78 (BP Location: Left Arm, Patient Position: Sitting, Cuff Size: Large)   Pulse (!) 58   Temp 98.6 F (37 C) (Oral)   Resp 17   Ht 6\' 2"  (1.88 m)   Wt 208 lb 12.8 oz (94.7 kg)   SpO2 98%   BMI 26.81 kg/m   Wt Readings from Last 3 Encounters:  10/18/22 208 lb 12.8 oz (94.7 kg)  06/26/22 220 lb (99.8 kg)  02/23/22 230 lb (104.3 kg)    Physical Exam Vitals and nursing note reviewed.  Constitutional:      General: He is not in acute distress.    Appearance: He is well-developed. He is not diaphoretic.     Comments: Well-appearing, comfortable, cooperative  HENT:     Head: Normocephalic and atraumatic.  Eyes:  General:        Right eye: No discharge.        Left eye: No discharge.     Conjunctiva/sclera: Conjunctivae normal.  Neck:     Thyroid: No thyromegaly.     Vascular: No carotid bruit.  Cardiovascular:     Rate and Rhythm: Normal rate and regular rhythm.     Pulses: Normal pulses.     Heart sounds: Normal heart sounds. No murmur heard. Pulmonary:     Effort: Pulmonary effort is normal. No respiratory distress.     Breath sounds: Normal breath sounds. No wheezing or rales.  Musculoskeletal:        General: Normal range of motion.     Cervical back: Normal range of motion and neck supple.     Right lower leg: No edema.     Left lower leg: No edema.  Lymphadenopathy:     Cervical: No cervical adenopathy.  Skin:    General: Skin is warm and dry.     Findings: No erythema or rash.  Neurological:     Mental Status: He is alert and oriented to person, place, and time. Mental status is at baseline.  Psychiatric:        Behavior: Behavior normal.     Comments: Well groomed, good  eye contact, normal speech and thoughts       Results for orders placed or performed in visit on 01/27/21  COMPLETE METABOLIC PANEL WITH GFR  Result Value Ref Range   Glucose, Bld 108 65 - 139 mg/dL   BUN 19 7 - 25 mg/dL   Creat 1.61 (H) 0.96 - 1.29 mg/dL   eGFR 66 > OR = 60 EA/VWU/[id number]   BUN/Creatinine Ratio 14 6 - 22 (calc)   Sodium 141 135 - 146 mmol/L   Potassium 4.5 3.5 - 5.3 mmol/L   Chloride 106 98 - 110 mmol/L   CO2 27 20 - 32 mmol/L   Calcium 9.3 8.6 - 10.3 mg/dL   Total Protein 6.6 6.1 - 8.1 g/dL   Albumin 4.3 3.6 - 5.1 g/dL   Globulin 2.3 1.9 - 3.7 g/dL (calc)   AG Ratio 1.9 1.0 - 2.5 (calc)   Total Bilirubin 0.5 0.2 - 1.2 mg/dL   Alkaline phosphatase (APISO) 38 36 - 130 U/L   AST 31 10 - 40 U/L   ALT 37 9 - 46 U/L  Lipid panel  Result Value Ref Range   Cholesterol 223 (H) <200 mg/dL   HDL 85 > OR = 40 mg/dL   Triglycerides 63 <147 mg/dL   LDL Cholesterol (Calc) 123 (H) mg/dL (calc)   Total CHOL/HDL Ratio 2.6 <5.0 (calc)   Non-HDL Cholesterol (Calc) 138 (H) <130 mg/dL (calc)  CBC with Differential/Platelet  Result Value Ref Range   WBC 3.9 3.8 - 10.8 Thousand/uL   RBC 5.60 4.20 - 5.80 Million/uL   Hemoglobin 12.9 (L) 13.2 - 17.1 g/dL   HCT 82.9 56.2 - 13.0 %   MCV 75.7 (L) 80.0 - 100.0 fL   MCH 23.0 (L) 27.0 - 33.0 pg   MCHC 30.4 (L) 32.0 - 36.0 g/dL   RDW 86.5 78.4 - 69.6 %   Platelets 180 140 - 400 Thousand/uL   MPV 11.2 7.5 - 12.5 fL   Neutro Abs 2,165 1,500 - 7,800 cells/uL   Lymphs Abs 1,135 850 - 3,900 cells/uL   Absolute Monocytes 371 200 - 950 cells/uL   Eosinophils Absolute 191 15 - 500 cells/uL  Basophils Absolute 39 0 - 200 cells/uL   Neutrophils Relative % 55.5 %   Total Lymphocyte 29.1 %   Monocytes Relative 9.5 %   Eosinophils Relative 4.9 %   Basophils Relative 1.0 %  Hemoglobin A1c  Result Value Ref Range   Hgb A1c MFr Bld 5.6 <5.7 % of total Hgb   Mean Plasma Glucose 114 mg/dL   eAG (mmol/L) 6.3 mmol/L  PSA  Result Value  Ref Range   PSA 0.50 < OR = 4.00 ng/mL  Hepatitis C antibody  Result Value Ref Range   Hepatitis C Ab NON-REACTIVE NON-REACTIVE   SIGNAL TO CUT-OFF 0.04 <1.00  TSH  Result Value Ref Range   TSH 0.60 0.40 - 4.50 mIU/L      Assessment & Plan:   Problem List Items Addressed This Visit   None Visit Diagnoses     Vasovagal syncope    -  Primary   Mild dehydration           Suspected most likely vasovagal syncopal episode given history and reassuring clinical exam and initial lab results  Note he Left ED without being seen. He did have initial triage EKG from EMS and BMET CBC Labs completed already.  Given constellation of classic symptoms and provoking factors with heat, dehydration. Labs consistent with dehydration as well. He is improving now 48 hours later. He feels some muscle ache and soreness fatigue but overall improved. Already returned to work.   Plan: 1. Reassurance, counseled on vasovagal syncope 2. Improve hydration, keep track of water consumption, add electrolytes better re hydration - Goal to limit provoking situation if possible.  If recurrent episodes of syncope or near syncope or chest pain dyspnea or other severe sudden worsening symptoms should seek care again immediately hospital ED / EMS.  Future further testing can pursue ZIO patch or Cardiology eval as indicated.    No orders of the defined types were placed in this encounter.     Follow up plan: Return if symptoms worsen or fail to improve.  Saralyn Pilar, DO [facility] 10/18/2022, 2:04 PM

## 2022-10-18 NOTE — Patient Instructions (Addendum)
Thank you for coming to the office today.  As discussed, I do not know the exact cause of your syncopal episodes (passing out), usually we divide this into either concerning or less concerning syncopal episodes. The most common type is Vasovagal Syncope, often described as you did with feeling flushed or sweating, lightheaded or dizzy, usually these are random episodes, triggered by a variety of causes (can be stress, emotional, physical, straining with exercise or bowel movement even, dehydration poor intake). Still a medical concern, but usually more of a benign problem, there is limited treatment or testing to be done for this type of syncope.  The concerning syncope is either caused by Cardiac (Heart) or Neurogenic (Brain), usually provoked by exertional activity, associated with high risk symptoms chest pain, tightness or pressure, shortness of breath, headache, or stroke like symptoms significant facial or arm/leg weakness, numbness, or related to seizure activity - if you develop any of these symptoms seek help immediately at hospital ED.    From now on, be mindful of possible syncopal episodes, I would encourage increase water hydration, try using a bottle or way to measure water intake, goal for at least 12-16 oz container about 2-3 times a day.  Good to add electrolytes to water or supplement low calorie gatorade.  Caution with caffeine tea coffee and other sources can dry you out more.  If syncopal episode occurs again without any of the above significant red flag symptoms, and it resolves on it's own and you don't feel persistently sick, then you may notify our office, follow-up in our office, or seek treatment at Urgent Care, we can discuss future referrals such as Cardiology and other testing.   Please schedule a Follow-up Appointment to: Return if symptoms worsen or fail to improve.  If you have any other questions or concerns, please feel free to call the office or send a message  through MyChart. You may also schedule an earlier appointment if necessary.  Additionally, you may be receiving a survey about your experience at our office within a few days to 1 week by e-mail or mail. We value your feedback.  Saralyn Pilar, DO [facility], [place]

## 2022-11-03 ENCOUNTER — Encounter: Payer: Self-pay | Admitting: Family Medicine

## 2022-11-03 ENCOUNTER — Ambulatory Visit: Payer: BC Managed Care – PPO | Admitting: Family Medicine

## 2022-11-03 VITALS — BP 128/78 | HR 54 | Ht 74.0 in | Wt 203.0 lb

## 2022-11-03 DIAGNOSIS — Z125 Encounter for screening for malignant neoplasm of prostate: Secondary | ICD-10-CM

## 2022-11-03 DIAGNOSIS — Z0279 Encounter for issue of other medical certificate: Secondary | ICD-10-CM

## 2022-11-03 DIAGNOSIS — Z0000 Encounter for general adult medical examination without abnormal findings: Secondary | ICD-10-CM

## 2022-11-03 DIAGNOSIS — N529 Male erectile dysfunction, unspecified: Secondary | ICD-10-CM

## 2022-11-03 DIAGNOSIS — E7800 Pure hypercholesterolemia, unspecified: Secondary | ICD-10-CM | POA: Diagnosis not present

## 2022-11-03 DIAGNOSIS — R7309 Other abnormal glucose: Secondary | ICD-10-CM | POA: Diagnosis not present

## 2022-11-03 MED ORDER — SILDENAFIL CITRATE 20 MG PO TABS
ORAL_TABLET | ORAL | 3 refills | Status: AC
Start: 2022-11-03 — End: ?

## 2022-11-03 NOTE — Patient Instructions (Addendum)
Thank you for coming to the office today.  We will complete the [facility]  Dates 7/3, 7/4, 7/7, 7/8, 7/14   Lab on Wednesday.  DUE for FASTING BLOOD WORK (no food or drink after midnight before the lab appointment, only water or coffee without cream/sugar on the morning of)  SCHEDULE "Lab Only" visit in the morning at the clinic for lab draw next week Weds 7/31 815am  Stay tuned for results, I will be on vacation 8/1 and back on 8/13, there may be a delay on your result.  - Make sure Lab Only appointment is at about 1 week before your next appointment, so that results will be available  For Lab Results, once available within 2-3 days of blood draw, you can can log in to MyChart online to view your results and a brief explanation. Also, we can discuss results at next follow-up visit.   Please schedule a Follow-up Appointment to: No follow-ups on file.  If you have any other questions or concerns, please feel free to call the office or send a message through MyChart. You may also schedule an earlier appointment if necessary.  Additionally, you may be receiving a survey about your experience at our office within a few days to 1 week by e-mail or mail. We value your feedback.  Saralyn Pilar, DO [facility], [place]

## 2022-11-03 NOTE — Progress Notes (Unsigned)
Subjective:    Patient ID: Samuel Davis. Samuel Davis, male    DOB: 25-Apr-1970, 52 y.o.   MRN: [id number]  Samuel Davis is a 52 y.o. male presenting on 11/03/2022 for Medical Management of Chronic Issues   HPI  Here for Annual Physical and Lab Orders for next week  Hx Elevated BP Past history of mild elevated BP on electronic cuff, repeat manual improved   HYPERLIPIDEMIA: - Reports no concerns. Last lipid panel due 2024 - Not on any cholesterol medication   Chronic Anemia, microcytic / Low WBC Prior results compared with stable slightly declined Hgb and WBC, previous history dating back to >20 yr ago with similar results. He is unaware of other family member with similar issues.  Right Shoulder, chronic tendonitis / osteoarthritis   Updates today with follow up to adjust FMLA terms. Last renewal updated 02/23/22   Reports chronic issue with episodic flares R shoulder pain. He has seen the [facility] specialist back in 12/2020 and had X-rays and diagnosed with arthritis and bursitis, symptoms worse at night, difficulty laying on R shoulder. Worse with repetitive activity. He has tried NSAID oral PRN with some temporary relief but decided to avoid nsaid longer term. - He was given some home exercise regimen and PT in the past   He does work involving reaching above shoulder for work, often lifting and reaching from that height. Can lift up to 50 lbs at work.   Has not had traumatic injury on R shoulder. No prior injection or surgery.   He has had episodic flares that interfere with his ability to lift or reach overhead and keep him out of work. He has benefit from [facility] intermittent absence due to flare ups and doctors apt.   When he is not having flare he is able to work. But if flared up, shoulder prevents him from working. He is having increased frequency of shoulder pain flare up that will require additional time out of work in future. Based on the chronic recurrent nature of this  problem he has had more frequent flares.     10/18/2022    1:47 PM 06/26/2022   10:10 AM 02/23/2022    3:20 PM  Depression screen PHQ 2/9  Decreased Interest 0 1 0  Down, Depressed, Hopeless 0 1 0  PHQ - 2 Score 0 2 0  Altered sleeping 0 1 0  Tired, decreased energy 0 1 0  Change in appetite 0 1 0  Feeling bad or failure about yourself  0 0 0  Trouble concentrating 0 0 0  Moving slowly or fidgety/restless 0 0 0  Suicidal thoughts 0 0 0  PHQ-9 Score 0 5 0  Difficult doing work/chores Not difficult at all Not difficult at all Not difficult at all    Past Medical History:  Diagnosis Date   Wears dentures    partial upper   Past Surgical History:  Procedure Laterality Date   COLONOSCOPY WITH PROPOFOL N/A 06/13/2019   Procedure: COLONOSCOPY WITH PROPOFOL;  Surgeon: Toney Reil, MD;  Location: [facility];  Service: Endoscopy;  Laterality: N/A;  Priority 4   THUMB ARTHROSCOPY  05/08/2018   Fracture repair.  [facility]   Social History   Socioeconomic History   Marital status: Single    Spouse name: Not on file   Number of children: Not on file   Years of education: [employer]   Highest education level: Associate degree: occupational, Scientist, product/process development, or vocational program  Occupational History   Occupation: Distribution  Tobacco Use   Smoking status: Former    Current packs/day: 0.00    Average packs/day: 0.8 packs/day for 10.0 years (7.5 ttl pk-yrs)    Types: Cigarettes    Start date: 09/09/2007    Quit date: 09/08/2017    Years since quitting: 5.1   Smokeless tobacco: Former   Tobacco comments:    Quit cold [place]  Building services engineer status: Never Used  Substance and Sexual Activity   Alcohol use: Not Currently    Alcohol/week: 7.0 standard drinks of alcohol    Types: 7 Cans of beer per week   Drug use: Never   Sexual activity: Not on file  Other Topics Concern   Not on file  Social History Narrative   ** Merged History Encounter **       Social  Determinants of Health   Financial Resource Strain: Not on file  Food Insecurity: Not on file  Transportation Needs: Not on file  Physical Activity: Not on file  Stress: Not on file  Social Connections: Not on file  Intimate Partner Violence: Not on file   Family History  Problem Relation Age of Onset   Healthy Mother    Colon cancer Father 51       possibly earlier, but not diagnosed   Hypertension Sister    Current Outpatient Medications on File Prior to Visit  Medication Sig   CALCIUM PO Take by mouth.   COLLAGEN PO Take by mouth.   VITAMIN D PO Take by mouth.   No current facility-administered medications on file prior to visit.    Review of Systems  Constitutional:  Negative for activity change, appetite change, chills, diaphoresis, fatigue and fever.  HENT:  Negative for congestion and hearing loss.   Eyes:  Negative for visual disturbance.  Respiratory:  Negative for cough, chest tightness, shortness of breath and wheezing.   Cardiovascular:  Negative for chest pain, palpitations and leg swelling.  Gastrointestinal:  Negative for abdominal pain, constipation, diarrhea, nausea and vomiting.  Genitourinary:  Negative for dysuria, frequency and hematuria.  Musculoskeletal:  Negative for arthralgias and neck pain.  Skin:  Negative for rash.  Neurological:  Negative for dizziness, weakness, light-headedness, numbness and headaches.  Hematological:  Negative for adenopathy.  Psychiatric/Behavioral:  Negative for behavioral problems, dysphoric mood and sleep disturbance.    Per HPI unless specifically indicated above      Objective:    BP 128/78   Pulse (!) 54   Ht 6\' 2"  (1.88 m)   Wt 203 lb (92.1 kg)   SpO2 97%   BMI 26.06 kg/m   Wt Readings from Last 3 Encounters:  11/03/22 203 lb (92.1 kg)  10/18/22 208 lb 12.8 oz (94.7 kg)  06/26/22 220 lb (99.8 kg)    Physical Exam Vitals and nursing note reviewed.  Constitutional:      General: He is not in acute  distress.    Appearance: He is well-developed. He is not diaphoretic.     Comments: Well-appearing, comfortable, cooperative  HENT:     Head: Normocephalic and atraumatic.  Eyes:     General:        Right eye: No discharge.        Left eye: No discharge.     Conjunctiva/sclera: Conjunctivae normal.  Neck:     Thyroid: No thyromegaly.  Cardiovascular:     Rate and Rhythm: Normal rate and regular rhythm.  Pulses: Normal pulses.     Heart sounds: Normal heart sounds. No murmur heard. Pulmonary:     Effort: Pulmonary effort is normal. No respiratory distress.     Breath sounds: Normal breath sounds. No wheezing or rales.  Musculoskeletal:     Cervical back: Normal range of motion and neck supple.     Comments: RIGHT Shoulder Inspection: Normal appearance bilateral symmetrical Palpation: Non-tender to palpation over anterior, lateral, or posterior shoulder  ROM: Improved but still reduced active ROM abduction limited above shoulder level, forward flexion intact. Some reduced internal rotation still. Special Testing: Rotator cuff testing negative for weakness with supraspinatus full can and empty can test, Impingement testing POSITIVE R shoulder provoked symptoms. Strength: Normal strength 5/5 flex/ext, ext rot / int rot, grip, rotator cuff str testing. Neurovascular: Distally intact pulses, sensation to light touch  Lymphadenopathy:     Cervical: No cervical adenopathy.  Skin:    General: Skin is warm and dry.     Findings: No erythema or rash.  Neurological:     Mental Status: He is alert and oriented to person, place, and time. Mental status is at baseline.  Psychiatric:        Behavior: Behavior normal.     Comments: Well groomed, good eye contact, normal speech and thoughts       Results for orders placed or performed in visit on 01/27/21  COMPLETE METABOLIC PANEL WITH GFR  Result Value Ref Range   Glucose, Bld 108 65 - 139 mg/dL   BUN 19 7 - 25 mg/dL   Creat 8.29  (H) 5.62 - 1.29 mg/dL   eGFR 66 > OR = 60 ZH/YQM/5.78I6   BUN/Creatinine Ratio 14 6 - 22 (calc)   Sodium 141 135 - 146 mmol/L   Potassium 4.5 3.5 - 5.3 mmol/L   Chloride 106 98 - 110 mmol/L   CO2 27 20 - 32 mmol/L   Calcium 9.3 8.6 - 10.3 mg/dL   Total Protein 6.6 6.1 - 8.1 g/dL   Albumin 4.3 3.6 - 5.1 g/dL   Globulin 2.3 1.9 - 3.7 g/dL (calc)   AG Ratio 1.9 1.0 - 2.5 (calc)   Total Bilirubin 0.5 0.2 - 1.2 mg/dL   Alkaline phosphatase (APISO) 38 36 - 130 U/L   AST 31 10 - 40 U/L   ALT 37 9 - 46 U/L  Lipid panel  Result Value Ref Range   Cholesterol 223 (H) <200 mg/dL   HDL 85 > OR = 40 mg/dL   Triglycerides 63 <962 mg/dL   LDL Cholesterol (Calc) 123 (H) mg/dL (calc)   Total CHOL/HDL Ratio 2.6 <5.0 (calc)   Non-HDL Cholesterol (Calc) 138 (H) <130 mg/dL (calc)  CBC with Differential/Platelet  Result Value Ref Range   WBC 3.9 3.8 - 10.8 Thousand/uL   RBC 5.60 4.20 - 5.80 Million/uL   Hemoglobin 12.9 (L) 13.2 - 17.1 g/dL   HCT 95.2 84.1 - 32.4 %   MCV 75.7 (L) 80.0 - 100.0 fL   MCH 23.0 (L) 27.0 - 33.0 pg   MCHC 30.4 (L) 32.0 - 36.0 g/dL   RDW 40.1 02.7 - 25.3 %   Platelets 180 140 - 400 Thousand/uL   MPV 11.2 7.5 - 12.5 fL   Neutro Abs 2,165 1,500 - 7,800 cells/uL   Lymphs Abs 1,135 850 - 3,900 cells/uL   Absolute Monocytes 371 200 - 950 cells/uL   Eosinophils Absolute 191 15 - 500 cells/uL   Basophils Absolute 39 0 - 200  cells/uL   Neutrophils Relative % 55.5 %   Total Lymphocyte 29.1 %   Monocytes Relative 9.5 %   Eosinophils Relative 4.9 %   Basophils Relative 1.0 %  Hemoglobin A1c  Result Value Ref Range   Hgb A1c MFr Bld 5.6 <5.7 % of total Hgb   Mean Plasma Glucose 114 mg/dL   eAG (mmol/L) 6.3 mmol/L  PSA  Result Value Ref Range   PSA 0.50 < OR = 4.00 ng/mL  Hepatitis C antibody  Result Value Ref Range   Hepatitis C Ab NON-REACTIVE NON-REACTIVE   SIGNAL TO CUT-OFF 0.04 <1.00  TSH  Result Value Ref Range   TSH 0.60 0.40 - 4.50 mIU/L      Assessment &  Plan:   Problem List Items Addressed This Visit     Elevated LDL cholesterol level   Relevant Orders   Lipid panel   TSH   Erectile dysfunction   Relevant Medications   sildenafil (REVATIO) 20 MG tablet   Other Visit Diagnoses     Annual physical exam    -  Primary   Relevant Orders   COMPLETE METABOLIC PANEL WITH GFR   Hemoglobin A1c   CBC with Differential/Platelet   TSH   Screening for prostate cancer       Relevant Orders   PSA   Abnormal glucose       Relevant Orders   Hemoglobin A1c       Updated Health Maintenance information  Reviewed recent lab results with patient Encouraged improvement to lifestyle with diet and exercise Goal of weight loss  Will complete paperwork for absence of leave He missed  7/3, 7/4, 7/7, 7/8, days due to continuous absence with medical reason for flare.    Meds ordered this encounter  Medications   sildenafil (REVATIO) 20 MG tablet    Sig: Take 1-5 pills about 30 min prior to sex. Start with 1 and increase as needed.    Dispense:  90 tablet    Refill:  3    Orders Placed This Encounter  Procedures   COMPLETE METABOLIC PANEL WITH GFR    Standing Status:   Future    Standing Expiration Date:   03/11/2023   Hemoglobin A1c    Standing Status:   Future    Standing Expiration Date:   03/11/2023   CBC with Differential/Platelet    Standing Status:   Future    Standing Expiration Date:   03/11/2023   Lipid panel    Standing Status:   Future    Standing Expiration Date:   03/11/2023    Order Specific Question:   Has the patient fasted?    Answer:   Yes   PSA    Standing Status:   Future    Standing Expiration Date:   03/11/2023   TSH    Standing Status:   Future    Standing Expiration Date:   02/03/2023     Follow up plan: Return if symptoms worsen or fail to improve.  Future fasting labs ordered for Weds 7/31  Saralyn Pilar, DO [facility] 11/03/2022, 11:01 AM

## 2022-11-04 ENCOUNTER — Encounter: Payer: Self-pay | Admitting: Family Medicine

## 2022-11-08 ENCOUNTER — Other Ambulatory Visit: Payer: BC Managed Care – PPO

## 2022-11-08 DIAGNOSIS — R7309 Other abnormal glucose: Secondary | ICD-10-CM | POA: Diagnosis not present

## 2022-11-08 DIAGNOSIS — Z125 Encounter for screening for malignant neoplasm of prostate: Secondary | ICD-10-CM | POA: Diagnosis not present

## 2022-11-08 DIAGNOSIS — Z0000 Encounter for general adult medical examination without abnormal findings: Secondary | ICD-10-CM

## 2022-11-08 DIAGNOSIS — E7800 Pure hypercholesterolemia, unspecified: Secondary | ICD-10-CM | POA: Diagnosis not present

## 2022-12-21 DIAGNOSIS — L700 Acne vulgaris: Secondary | ICD-10-CM | POA: Diagnosis not present

## 2023-01-16 ENCOUNTER — Encounter: Payer: Self-pay | Admitting: Family Medicine

## 2023-01-16 ENCOUNTER — Ambulatory Visit (INDEPENDENT_AMBULATORY_CARE_PROVIDER_SITE_OTHER): Payer: BC Managed Care – PPO | Admitting: Family Medicine

## 2023-01-16 VITALS — BP 112/68 | HR 66 | Ht 74.0 in | Wt 201.0 lb

## 2023-01-16 DIAGNOSIS — M7551 Bursitis of right shoulder: Secondary | ICD-10-CM

## 2023-01-16 DIAGNOSIS — G8929 Other chronic pain: Secondary | ICD-10-CM

## 2023-01-16 DIAGNOSIS — M25511 Pain in right shoulder: Secondary | ICD-10-CM

## 2023-01-16 NOTE — Patient Instructions (Addendum)
FMLA paperwork will be faxed on 01/17/23  Please schedule a Follow-up Appointment to: Return in about 6 months (around 07/17/2023), or if symptoms worsen or fail to improve, for FMLA recert.  If you have any other questions or concerns, please feel free to call the office or send a message through MyChart. You may also schedule an earlier appointment if necessary.  Additionally, you may be receiving a survey about your experience at our office within a few days to 1 week by e-mail or mail. We value your feedback.  Saralyn Pilar, DO [facility], [place]

## 2023-01-16 NOTE — Progress Notes (Signed)
Subjective:    Patient ID: Samuel Davis. Platte, male    DOB: 04-08-1971, 51 y.o.   MRN: [id number]  Samuel Davis is a 52 y.o. male presenting on 01/16/2023 for Shoulder Pain   HPI  Discussed the use of AI scribe software for clinical note transcription with the patient, who gave verbal consent to proceed.  Right Shoulder, chronic tendonitis / osteoarthritis     The patient, with a history of right shoulder tendinitis, arthritis, and chronic pain, presents for a renewal of their FMLA paperwork.   Last visit for this same problem with me was 06/26/22. See prior note for background info.  Reports chronic issue with episodic flares R shoulder pain. He has seen the [facility] specialist back in 12/2020 and had X-rays and diagnosed with arthritis and bursitis, symptoms worse at night, difficulty laying on R shoulder. Worse with repetitive activity. He has tried NSAID oral PRN with some temporary relief but decided to avoid nsaid longer term.  They have been experiencing episodic flare-ups of their shoulder condition, which limit their ability to lift and reach, impacting their job performance. The patient has already missed two days of work due to these flare-ups, on September 29th and October 6th.  The patient manages their symptoms with over-the-counter pain medication and topical ointment application. They have previously tried muscle relaxants such as baclofen   He has pursued Physical Therapy previously  He does work involving reaching above shoulder for work, often lifting and reaching from that height. Can lift up to 50 lbs at work.   Has not had traumatic injury on R shoulder. No prior injection or surgery.   He has had episodic flares that interfere with his ability to lift or reach overhead and keep him out of work. He has benefit from [facility] intermittent absence due to flare ups and doctors apt.   When he is not having flare he is able to work. But if flared up, shoulder  prevents him from working. He is having increased frequency of shoulder pain flare up that will require additional time out of work in future. Based on the chronic recurrent nature of this problem he has had more frequent flares.  This will be a new FMLA case with Leave started 01/06/23  He has discussed with his employer HR and they are able to allow up to 6 single episodic flares per month.      10/18/2022    1:47 PM 06/26/2022   10:10 AM 02/23/2022    3:20 PM  Depression screen PHQ 2/9  Decreased Interest 0 1 0  Down, Depressed, Hopeless 0 1 0  PHQ - 2 Score 0 2 0  Altered sleeping 0 1 0  Tired, decreased energy 0 1 0  Change in appetite 0 1 0  Feeling bad or failure about yourself  0 0 0  Trouble concentrating 0 0 0  Moving slowly or fidgety/restless 0 0 0  Suicidal thoughts 0 0 0  PHQ-9 Score 0 5 0  Difficult doing work/chores Not difficult at all Not difficult at all Not difficult at all    Social History   Tobacco Use   Smoking status: Former    Current packs/day: 0.00    Average packs/day: 0.8 packs/day for 10.0 years (7.5 ttl pk-yrs)    Types: Cigarettes    Start date: 09/09/2007    Quit date: 09/08/2017    Years since quitting: 5.3   Smokeless tobacco: Former   Tobacco comments:  Quit cold [place]  Vaping Use   Vaping status: Never Used  Substance Use Topics   Alcohol use: Not Currently    Alcohol/week: 7.0 standard drinks of alcohol    Types: 7 Cans of beer per week   Drug use: Never    Review of Systems Per HPI unless specifically indicated above     Objective:    BP 112/68   Pulse 66   Ht 6\' 2"  (1.88 m)   Wt 201 lb (91.2 kg)   SpO2 97%   BMI 25.81 kg/m   Wt Readings from Last 3 Encounters:  01/16/23 201 lb (91.2 kg)  11/03/22 203 lb (92.1 kg)  10/18/22 208 lb 12.8 oz (94.7 kg)    Physical Exam Vitals and nursing note reviewed.  Constitutional:      General: He is not in acute distress.    Appearance: He is well-developed. He is not  diaphoretic.     Comments: Well-appearing, comfortable, cooperative  HENT:     Head: Normocephalic and atraumatic.  Eyes:     General:        Right eye: No discharge.        Left eye: No discharge.     Conjunctiva/sclera: Conjunctivae normal.  Neck:     Thyroid: No thyromegaly.  Cardiovascular:     Rate and Rhythm: Normal rate and regular rhythm.     Pulses: Normal pulses.     Heart sounds: Normal heart sounds. No murmur heard. Pulmonary:     Effort: Pulmonary effort is normal. No respiratory distress.     Breath sounds: Normal breath sounds. No wheezing or rales.  Musculoskeletal:     Cervical back: Normal range of motion and neck supple.     Comments: RIGHT Shoulder Inspection: Normal appearance bilateral symmetrical Palpation: Non-tender to palpation over anterior, lateral, or posterior shoulder  ROM: Improved but still reduced active ROM abduction limited above shoulder level, forward flexion intact. Some reduced internal rotation still. Special Testing: Rotator cuff testing negative for weakness with supraspinatus full can and empty can test, Impingement testing POSITIVE R shoulder provoked symptoms. Strength: Normal strength 5/5 flex/ext, ext rot / int rot, grip, rotator cuff str testing. Neurovascular: Distally intact pulses, sensation to light touch  Lymphadenopathy:     Cervical: No cervical adenopathy.  Skin:    General: Skin is warm and dry.     Findings: No erythema or rash.  Neurological:     Mental Status: He is alert and oriented to person, place, and time. Mental status is at baseline.  Psychiatric:        Behavior: Behavior normal.     Comments: Well groomed, good eye contact, normal speech and thoughts    Results for orders placed or performed in visit on 11/08/22  TSH  Result Value Ref Range   TSH 1.75 0.40 - 4.50 mIU/L  PSA  Result Value Ref Range   PSA 0.53 < OR = 4.00 ng/mL  Lipid panel  Result Value Ref Range   Cholesterol 193 <200 mg/dL   HDL  86 > OR = 40 mg/dL   Triglycerides 37 <161 mg/dL   LDL Cholesterol (Calc) 96 mg/dL (calc)   Total CHOL/HDL Ratio 2.2 <5.0 (calc)   Non-HDL Cholesterol (Calc) 107 <130 mg/dL (calc)  CBC with Differential/Platelet  Result Value Ref Range   WBC 4.2 3.8 - 10.8 Thousand/uL   RBC 5.19 4.20 - 5.80 Million/uL   Hemoglobin 11.8 (L) 13.2 - 17.1 g/dL   HCT  39.4 38.5 - 50.0 %   MCV 75.9 (L) 80.0 - 100.0 fL   MCH 22.7 (L) 27.0 - 33.0 pg   MCHC 29.9 (L) 32.0 - 36.0 g/dL   RDW 16.1 09.6 - 04.5 %   Platelets 173 140 - 400 Thousand/uL   MPV 10.1 7.5 - 12.5 fL   Neutro Abs 1,747 1,500 - 7,800 cells/uL   Lymphs Abs 1,890 850 - 3,900 cells/uL   Absolute Monocytes 332 200 - 950 cells/uL   Eosinophils Absolute 181 15 - 500 cells/uL   Basophils Absolute 50 0 - 200 cells/uL   Neutrophils Relative % 41.6 %   Total Lymphocyte 45.0 %   Monocytes Relative 7.9 %   Eosinophils Relative 4.3 %   Basophils Relative 1.2 %  Hemoglobin A1c  Result Value Ref Range   Hgb A1c MFr Bld 6.0 (H) <5.7 % of total Hgb   Mean Plasma Glucose 126 mg/dL   eAG (mmol/L) 7.0 mmol/L  COMPLETE METABOLIC PANEL WITH GFR  Result Value Ref Range   Glucose, Bld 103 (H) 65 - 99 mg/dL   BUN 16 7 - 25 mg/dL   Creat 4.09 8.11 - 9.14 mg/dL   eGFR 99 > OR = 60 NW/GNF/6.21H0   BUN/Creatinine Ratio SEE NOTE: 6 - 22 (calc)   Sodium 137 135 - 146 mmol/L   Potassium 4.2 3.5 - 5.3 mmol/L   Chloride 102 98 - 110 mmol/L   CO2 29 20 - 32 mmol/L   Calcium 9.4 8.6 - 10.3 mg/dL   Total Protein 6.5 6.1 - 8.1 g/dL   Albumin 4.5 3.6 - 5.1 g/dL   Globulin 2.0 1.9 - 3.7 g/dL (calc)   AG Ratio 2.3 1.0 - 2.5 (calc)   Total Bilirubin 0.6 0.2 - 1.2 mg/dL   Alkaline phosphatase (APISO) 38 35 - 144 U/L   AST 26 10 - 35 U/L   ALT 30 9 - 46 U/L      Assessment & Plan:   Problem List Items Addressed This Visit   None Visit Diagnoses     Chronic right shoulder pain    -  Primary   Chronic bursitis of right shoulder               Right  Shoulder Tendinitis and Arthritis  Consistent with RIGHT-shoulder bursitis vs rotator cuff tendinopathy with some reduced active ROM but without significant evidence of muscle tear (no weakness).  Known repetitive overhead/strenuous activity as likely etiology  52 yr old patient with likely underlying arthritis Imaging per Orthopedics ([facility] 12/2020)   He continues to have episodic flare up of R shoulder pain due to work and this has limited his ability to work during flares requiring additional time off for flares.  Plan: 1. Continue anti inflammatory topical - OTC Voltaren (generic Diclofenac) topical 2-4 times a day as needed for pain swelling of affected joint for 1-2 weeks or longer. - Cannot tolerate oral NSAIDs 2. Continue Baclofen muscle relaxant 5-10mg  QHS PRN caution sedation 3. May take Tylenol Ex Str 1-2 q 6 hr PRN 4. Relative rest but keep shoulder mobile, demonstrated ROM exercises, avoid heavy lifting 5. May try heating pad PRN   If still not improving and needs further management, we can pursue future follow-up with Orthopedics considering cortisone injection in joint / guided Physical Therapy or other procedural intervention if not improving.   Updated New FMLA Intermittent Leave documentation today  No work restrictions given today. When not having flare up,  he can perform all tasks.   Intermittent only  Episodic flares Frequency 6 times per 1 month Duration 1 day per episode  Anticipate duration 6-12+ months, may require renewal yearly or unless problem resolves   Will complete paperwork today and it can be faxed to [facility] 01/17/23, copy given to patient today    No orders of the defined types were placed in this encounter.    Follow up plan: Return in about 6 months (around 07/17/2023), or if symptoms worsen or fail to improve, for FMLA recert.    Saralyn Pilar, DO [facility] 01/16/2023, 9:01  AM

## 2023-01-16 NOTE — Telephone Encounter (Signed)
Completed office visit today for paperwork. This is an old [employer]. Closing it now.

## 2023-01-25 ENCOUNTER — Ambulatory Visit: Payer: BC Managed Care – PPO | Admitting: Family Medicine

## 2023-01-26 ENCOUNTER — Ambulatory Visit: Payer: BC Managed Care – PPO | Admitting: Family Medicine

## 2023-02-13 ENCOUNTER — Ambulatory Visit: Payer: BC Managed Care – PPO | Admitting: Family Medicine

## 2023-02-13 ENCOUNTER — Encounter: Payer: Self-pay | Admitting: Family Medicine

## 2023-02-13 VITALS — BP 106/74 | HR 56 | Wt 207.0 lb

## 2023-02-13 DIAGNOSIS — M7551 Bursitis of right shoulder: Secondary | ICD-10-CM

## 2023-02-13 DIAGNOSIS — M25511 Pain in right shoulder: Secondary | ICD-10-CM | POA: Diagnosis not present

## 2023-02-13 DIAGNOSIS — G8929 Other chronic pain: Secondary | ICD-10-CM | POA: Diagnosis not present

## 2023-02-13 NOTE — Progress Notes (Signed)
Subjective:    Patient ID: Samuel Davis, male    DOB: 1970-06-17, 52 y.o.   MRN: [id number]  Samuel Davis is a 52 y.o. male presenting on 02/13/2023 for Follow-up (FMLA forms)   HPI  Right Shoulder, chronic tendonitis / osteoarthritis      The patient, with a history of right shoulder tendinitis, arthritis, and chronic pain, presents for an update to his Intermittent FMLA paperwork.    Last visit for this same problem with me was 01/16/23. See prior note for background info.   Reports chronic issue with episodic flares R shoulder pain. He has seen the [facility] specialist back in 12/2020 and had X-rays and diagnosed with arthritis and bursitis, symptoms worse at night, difficulty laying on R shoulder. Worse with repetitive activity. He has tried NSAID oral PRN with some temporary relief but decided to avoid nsaid longer term.   They have been experiencing episodic flare-ups of their shoulder condition, which limit their ability to lift and reach, impacting their job performance.   The patient manages their symptoms with over-the-counter pain medication and topical ointment application. They have previously tried muscle relaxants such as baclofen    He has pursued Physical Therapy previously   He does work involving reaching above shoulder for work, often lifting and reaching from that height. Can lift up to 50 lbs at work.   Has not had traumatic injury on R shoulder. No prior injection or surgery.   He has had episodic flares that interfere with his ability to lift or reach overhead and keep him out of work. He has benefit from [facility] intermittent absence due to flare ups and doctors apt.   When he is not having flare he is able to work. But if flared up, shoulder prevents him from working. He is having increased frequency of shoulder pain flare up that will require additional time out of work in future. Based on the chronic recurrent nature of this problem he has had more  frequent flares.  Currently his Intermittent FMLA coverage is approved for 6 single episodic flares per month   UPDATE: - Today patient requested new copy of FMLA to be sent to [facility] for approval of a Continuous Leave as well from missed time on 10/27, 10/28, 10/29 dates. His current Intermittent plan does not cover continuous absence, He was given new paperwork and asked to schedule for approval.        02/13/2023    3:32 PM 10/18/2022    1:47 PM 06/26/2022   10:10 AM  Depression screen PHQ 2/9  Decreased Interest 0 0 1  Down, Depressed, Hopeless 0 0 1  PHQ - 2 Score 0 0 2  Altered sleeping 0 0 1  Tired, decreased energy 0 0 1  Change in appetite 0 0 1  Feeling bad or failure about yourself  0 0 0  Trouble concentrating 0 0 0  Moving slowly or fidgety/restless 0 0 0  Suicidal thoughts 0 0 0  PHQ-9 Score 0 0 5  Difficult doing work/chores Not difficult at all Not difficult at all Not difficult at all    Social History   Tobacco Use   Smoking status: Former    Current packs/day: 0.00    Average packs/day: 0.8 packs/day for 10.0 years (7.5 ttl pk-yrs)    Types: Cigarettes    Start date: 09/09/2007    Quit date: 09/08/2017    Years since quitting: 5.4   Smokeless tobacco: Former  Tobacco comments:    Quit cold [place]  Vaping Use   Vaping status: Never Used  Substance Use Topics   Alcohol use: Not Currently    Alcohol/week: 7.0 standard drinks of alcohol    Types: 7 Cans of beer per week   Drug use: Never    Review of Systems Per HPI unless specifically indicated above     Objective:    BP 106/74   Pulse (!) 56   Wt 207 lb (93.9 kg)   SpO2 99%   BMI 26.58 kg/m   Wt Readings from Last 3 Encounters:  02/13/23 207 lb (93.9 kg)  01/16/23 201 lb (91.2 kg)  11/03/22 203 lb (92.1 kg)    Physical Exam Vitals and nursing note reviewed.  Constitutional:      General: He is not in acute distress.    Appearance: He is well-developed. He is not diaphoretic.      Comments: Well-appearing, comfortable, cooperative  HENT:     Head: Normocephalic and atraumatic.  Eyes:     General:        Right eye: No discharge.        Left eye: No discharge.     Conjunctiva/sclera: Conjunctivae normal.  Neck:     Thyroid: No thyromegaly.  Cardiovascular:     Rate and Rhythm: Normal rate and regular rhythm.     Pulses: Normal pulses.     Heart sounds: Normal heart sounds. No murmur heard. Pulmonary:     Effort: Pulmonary effort is normal. No respiratory distress.     Breath sounds: Normal breath sounds. No wheezing or rales.  Musculoskeletal:     Cervical back: Normal range of motion and neck supple.     Comments: RIGHT Shoulder Inspection: Normal appearance bilateral symmetrical Palpation: Non-tender to palpation over anterior, lateral, or posterior shoulder  ROM: Improved but still reduced active ROM abduction limited above shoulder level, forward flexion intact. Some reduced internal rotation still. Special Testing: Rotator cuff testing negative for weakness with supraspinatus full can and empty can test, Impingement testing POSITIVE R shoulder provoked symptoms. Strength: Normal strength 5/5 flex/ext, ext rot / int rot, grip, rotator cuff str testing. Neurovascular: Distally intact pulses, sensation to light touch  Lymphadenopathy:     Cervical: No cervical adenopathy.  Skin:    General: Skin is warm and dry.     Findings: No erythema or rash.  Neurological:     Mental Status: He is alert and oriented to person, place, and time. Mental status is at baseline.  Psychiatric:        Behavior: Behavior normal.     Comments: Well groomed, good eye contact, normal speech and thoughts    Results for orders placed or performed in visit on 11/08/22  TSH  Result Value Ref Range   TSH 1.75 0.40 - 4.50 mIU/L  PSA  Result Value Ref Range   PSA 0.53 < OR = 4.00 ng/mL  Lipid panel  Result Value Ref Range   Cholesterol 193 <200 mg/dL   HDL 86 > OR = 40 mg/dL    Triglycerides 37 <540 mg/dL   LDL Cholesterol (Calc) 96 mg/dL (calc)   Total CHOL/HDL Ratio 2.2 <5.0 (calc)   Non-HDL Cholesterol (Calc) 107 <130 mg/dL (calc)  CBC with Differential/Platelet  Result Value Ref Range   WBC 4.2 3.8 - 10.8 Thousand/uL   RBC 5.19 4.20 - 5.80 Million/uL   Hemoglobin 11.8 (L) 13.2 - 17.1 g/dL   HCT 98.1 19.1 -  50.0 %   MCV 75.9 (L) 80.0 - 100.0 fL   MCH 22.7 (L) 27.0 - 33.0 pg   MCHC 29.9 (L) 32.0 - 36.0 g/dL   RDW 95.0 93.2 - 67.1 %   Platelets 173 140 - 400 Thousand/uL   MPV 10.1 7.5 - 12.5 fL   Neutro Abs 1,747 1,500 - 7,800 cells/uL   Lymphs Abs 1,890 850 - 3,900 cells/uL   Absolute Monocytes 332 200 - 950 cells/uL   Eosinophils Absolute 181 15 - 500 cells/uL   Basophils Absolute 50 0 - 200 cells/uL   Neutrophils Relative % 41.6 %   Total Lymphocyte 45.0 %   Monocytes Relative 7.9 %   Eosinophils Relative 4.3 %   Basophils Relative 1.2 %  Hemoglobin A1c  Result Value Ref Range   Hgb A1c MFr Bld 6.0 (H) <5.7 % of total Hgb   Mean Plasma Glucose 126 mg/dL   eAG (mmol/L) 7.0 mmol/L  COMPLETE METABOLIC PANEL WITH GFR  Result Value Ref Range   Glucose, Bld 103 (H) 65 - 99 mg/dL   BUN 16 7 - 25 mg/dL   Creat 2.45 8.09 - 9.83 mg/dL   eGFR 99 > OR = 60 JA/SNK/5.39J6   BUN/Creatinine Ratio SEE NOTE: 6 - 22 (calc)   Sodium 137 135 - 146 mmol/L   Potassium 4.2 3.5 - 5.3 mmol/L   Chloride 102 98 - 110 mmol/L   CO2 29 20 - 32 mmol/L   Calcium 9.4 8.6 - 10.3 mg/dL   Total Protein 6.5 6.1 - 8.1 g/dL   Albumin 4.5 3.6 - 5.1 g/dL   Globulin 2.0 1.9 - 3.7 g/dL (calc)   AG Ratio 2.3 1.0 - 2.5 (calc)   Total Bilirubin 0.6 0.2 - 1.2 mg/dL   Alkaline phosphatase (APISO) 38 35 - 144 U/L   AST 26 10 - 35 U/L   ALT 30 9 - 46 U/L      Assessment & Plan:   Problem List Items Addressed This Visit   None Visit Diagnoses     Chronic right shoulder pain    -  Primary   Chronic bursitis of right shoulder           Right Shoulder Tendinitis and  Arthritis   Consistent with RIGHT-shoulder bursitis vs rotator cuff tendinopathy with some reduced active ROM but without significant evidence of muscle tear (no weakness).  Known repetitive overhead/strenuous activity as likely etiology  52 yr old patient with likely underlying arthritis Imaging per Orthopedics ([facility] 12/2020)   He continues to have episodic flare up of R shoulder pain due to work and this has limited his ability to work during flares requiring additional time off for flares.   Plan: 1. Continue anti inflammatory topical - OTC Voltaren (generic Diclofenac) topical 2-4 times a day as needed for pain swelling of affected joint for 1-2 weeks or longer. - Cannot tolerate oral NSAIDs 2. Continue Baclofen muscle relaxant 5-10mg  QHS PRN caution sedation 3. May take Tylenol Ex Str 1-2 q 6 hr PRN 4. Relative rest but keep shoulder mobile, demonstrated ROM exercises, avoid heavy lifting 5. May try heating pad PRN   If still not improving and needs further management, we can pursue future follow-up with Orthopedics considering cortisone injection in joint / guided Physical Therapy or other procedural intervention if not improving.   Regarding FMLA  We called to discuss his case w/ [employer]  Currently he has Active FMLA Intermittent Leave policy case # [id number]  Paperwork was faxed back on 01/17/23 already approved.  No work restrictions When not having flare up, he can perform all tasks.   Intermittent only   Episodic flares Frequency 6 times per 1 month Duration 1 day per episode   -----------------------------  They actually confirmed today verbally on phone with myself and patient that his recent dates 10/27, 10/28, 10/29 were all APPROVED under this existing Intermittent FMLA policy.  He does NOT need a new case opened for Continuous Leave at this time.   No orders of the defined types were placed in this encounter.     Follow up plan: Return  if symptoms worsen or fail to improve.   Saralyn Pilar, DO [facility] 02/13/2023, 4:03 PM

## 2023-02-13 NOTE — Patient Instructions (Addendum)
Thank you for coming to the office today.  We will pause on the new [employer] Form today as the dates are approved.  Please schedule a Follow-up Appointment to: Return if symptoms worsen or fail to improve.  If you have any other questions or concerns, please feel free to call the office or send a message through MyChart. You may also schedule an earlier appointment if necessary.  Additionally, you may be receiving a survey about your experience at our office within a few days to 1 week by e-mail or mail. We value your feedback.  Saralyn Pilar, DO [facility], [place]

## 2023-03-16 ENCOUNTER — Ambulatory Visit: Payer: Self-pay | Admitting: *Deleted

## 2023-03-16 NOTE — Telephone Encounter (Signed)
Left message for patient to return call.

## 2023-03-16 NOTE — Telephone Encounter (Signed)
Appointment made for 04/02/23.   Can you see the patient sooner?

## 2023-03-16 NOTE — Telephone Encounter (Signed)
  Chief Complaint: Patient is calling to schedule injection in shoulder Symptoms: chronic shoulder pain- he has seen provider for this and breifly discussed injections- he has decided it is time.  Frequency: chronic pain- getting worse  Disposition: [] ED /[] Urgent Care (no appt availability in office) / [] Appointment(In office/virtual)/ []  [place] [facility]/ [] Home Care/ [] Refused Recommended Disposition /[] [place] [facility]/ [x]  Follow-up with PCP Additional Notes: Patient is requesting appointment for injection - patient informed this is considered procedure and will be scheduled by nurse in office

## 2023-03-16 NOTE — Telephone Encounter (Signed)
Reason for Disposition  [1] SEVERE pain AND [2] not improved 2 hours after pain medicine  Answer Assessment - Initial Assessment Questions 1. ONSET: "When did the pain start?"     Chronic pain 2. LOCATION: "Where is the pain located?"     Right shoulder 3. PAIN: "How bad is the pain?" (Scale 1-10; or mild, moderate, severe)   - MILD (1-3): doesn't interfere with normal activities   - MODERATE (4-7): interferes with normal activities (e.g., work or school) or awakens from sleep   - SEVERE (8-10): excruciating pain, unable to do any normal activities, unable to move arm at all due to pain     10/10- patient unable to sleep o it- wakes patient up and keeps him out of work 4. WORK OR EXERCISE: "Has there been any recent work or exercise that involved this part of the body?"     Unable to work some days  6. OTHER SYMPTOMS: "Do you have any other symptoms?" (e.g., neck pain, swelling, rash, fever, numbness, weakness)     no  Protocols used: Shoulder Pain-A-AH

## 2023-03-17 NOTE — Telephone Encounter (Signed)
Please offer the patient any open available Acute Visit apt next available. These are reserved for Same Day apt, but we can use one for this procedure for shoulder injection.  Thank you!  Saralyn Pilar, DO [facility] 03/17/2023, 1:09 PM

## 2023-03-19 ENCOUNTER — Ambulatory Visit: Payer: Self-pay

## 2023-03-19 NOTE — Telephone Encounter (Signed)
Call has been handled. No further action needed.

## 2023-03-19 NOTE — Telephone Encounter (Signed)
Patient notified of appointment on 03/26/23.   Verbal understanding.

## 2023-03-19 NOTE — Telephone Encounter (Signed)
  Chief Complaint: severe right shoulder and side pain//wanting a cortisone shot Symptoms: over a year Disposition: [] ED /[] Urgent Care (no appt availability in office) / [] Appointment(In office/virtual)/ []  [facility]/ [] Home Care/ [] Refused Recommended Disposition /[] [facility]/ [x]  Follow-up with PCP Additional Notes: refused UC- has upcoming appt but stated he cannot wait until then. Wait listed and routed note to office for PCP to review. No sooner appts noted.  Reason for Disposition  [1] SEVERE pain AND [2] not improved 2 hours after pain medicine  Answer Assessment - Initial Assessment Questions 1. ONSET: "When did the pain start?"     Over a year  2. LOCATION: "Where is the pain located?"     Right shoulder and down  3. PAIN: "How bad is the pain?" (Scale 1-10; or mild, moderate, severe)   - MILD (1-3): doesn't interfere with normal activities   - MODERATE (4-7): interferes with normal activities (e.g., work or school) or awakens from sleep   - SEVERE (8-10): excruciating pain, unable to do any normal activities, unable to move arm at all due to pain     10 5. CAUSE: "What do you think is causing the shoulder pain?"     H/o shoulder pain 6. OTHER SYMPTOMS: "Do you have any other symptoms?" (e.g., neck pain, swelling, rash, fever, numbness, weakness)     Barely walk  7. PREGNANCY: "Is there any chance you are pregnant?" "When was your last menstrual period?"     no  Protocols used: Shoulder Pain-A-AH

## 2023-03-19 NOTE — Telephone Encounter (Signed)
Copied from CRM [id number]. Topic: Appointments - Appointment Scheduling >> Mar 16, 2023  3:53 PM Phill Myron wrote: Jerene Bears, CMA Pt Samuel Davis is returning your call regarding an injection. (Tried back line no one answered)

## 2023-03-19 NOTE — Telephone Encounter (Signed)
Attempted to call   We can move appointment to 03/26/23 at 2:40

## 2023-03-20 ENCOUNTER — Other Ambulatory Visit: Payer: Self-pay

## 2023-03-20 ENCOUNTER — Emergency Department: Payer: BC Managed Care – PPO

## 2023-03-20 DIAGNOSIS — I10 Essential (primary) hypertension: Secondary | ICD-10-CM | POA: Diagnosis not present

## 2023-03-20 DIAGNOSIS — M79604 Pain in right leg: Secondary | ICD-10-CM | POA: Diagnosis present

## 2023-03-20 NOTE — ED Triage Notes (Signed)
Pt reports 4 days ago he began to have right thigh pain, pt was seen at UC this morning and dx with strained muscle. Pt was given medication that did not help. Pt denies any known injury to area. No swelling or redness.

## 2023-03-21 ENCOUNTER — Emergency Department: Payer: BC Managed Care – PPO

## 2023-03-21 ENCOUNTER — Emergency Department
Admission: EM | Admit: 2023-03-21 | Discharge: 2023-03-21 | Disposition: A | Discharge status: Home / Self Care | Payer: BC Managed Care – PPO | Attending: Emergency Medicine | Admitting: Emergency Medicine

## 2023-03-21 DIAGNOSIS — M79604 Pain in right leg: Secondary | ICD-10-CM

## 2023-03-21 LAB — BASIC METABOLIC PANEL
Anion gap: 9 (ref 5–15)
BUN: 16 mg/dL (ref 6–20)
CO2: 21 mmol/L — ABNORMAL LOW (ref 22–32)
Calcium: 8.9 mg/dL (ref 8.9–10.3)
Chloride: 107 mmol/L (ref 98–111)
Creatinine, Ser: 0.8 mg/dL (ref 0.61–1.24)
GFR, Estimated: >60 mL/min (ref >60)
Glucose, Bld: 107 mg/dL — ABNORMAL HIGH (ref 70–99)
Potassium: 4.1 mmol/L (ref 3.5–5.1)
Sodium: 137 mmol/L (ref 135–145)

## 2023-03-21 LAB — CBC
HCT: 41.8 % (ref 39.0–52.0)
Hemoglobin: 13.3 g/dL (ref 13.0–17.0)
MCH: 23.4 pg — ABNORMAL LOW (ref 26.0–34.0)
MCHC: 31.8 g/dL (ref 30.0–36.0)
MCV: 73.5 fL — ABNORMAL LOW (ref 80.0–100.0)
Platelets: 256 10*3/uL (ref 150–400)
RBC: 5.69 MIL/uL (ref 4.22–5.81)
RDW: 14.4 % (ref 11.5–15.5)
WBC: 5.2 10*3/uL (ref 4.0–10.5)
nRBC: 0 % (ref 0.0–0.2)

## 2023-03-21 LAB — CK: Total CK: 166 U/L (ref 49–397)

## 2023-03-21 MED ORDER — KETOROLAC TROMETHAMINE 30 MG/ML IJ SOLN
30.0000 mg | Freq: Once | INTRAMUSCULAR | Status: AC
  Administered 2023-03-21: 30 mg via INTRAMUSCULAR
  Filled 2023-03-21: qty 1

## 2023-03-21 MED ORDER — ACETAMINOPHEN 500 MG PO TABS
1000.0000 mg | ORAL_TABLET | Freq: Once | ORAL | Status: AC
  Administered 2023-03-21: 1000 mg via ORAL
  Filled 2023-03-21: qty 2

## 2023-03-21 MED ORDER — OXYCODONE-ACETAMINOPHEN 5-325 MG PO TABS: 1.0000 | ORAL_TABLET | Freq: Four times a day (QID) | ORAL | 0 refills | Status: AC | PRN

## 2023-03-21 NOTE — Discharge Instructions (Signed)
You were seen in the emergency department for right thigh pain.  You had an x-ray done that did not show any broken bones.  You had a ultrasound done that did not show any signs of blood clots.  Your lab work was normal.   Pain control:  Ibuprofen (motrin/aleve/advil) - You can take 3 tablets (600 mg) every 6 hours as needed for pain/fever.  Acetaminophen (tylenol) - You can take 2 extra strength tablets (1000 mg) every 6 hours as needed for pain/fever.  You can alternate these medications or take them together.  Make sure you eat food/drink water when taking these medications.  You were given a prescription for narcotic pain medications.  Take only if in severe pain.  These are very addictive medications.  These medications can make you constipated.  If you need to take more than 1-2 doses, start a stool softner.  If you become constipated, take 1 capfull of MiraLAX, can repeat untill having regular bowel movements.  Keep this medication out of reach of any children.  Thank you for choosing [place] for your health care, it was my pleasure to care for you today!  Corena Herter, MD

## 2023-03-21 NOTE — ED Provider Notes (Signed)
[facility] Provider Note    Event Date/Time   First MD Initiated Contact with Patient 03/21/23 [phone]     (approximate)   History   Leg Pain   HPI  Jamyron Waymack. Windom is a 52 y.o. male past medical history significant for hypertension who presents to the emergency department with right leg pain.  Patient Dors is right thigh and leg pain that has been ongoing since Saturday.  Endorses a constant pain that is nonradiating.  Denies any falls or trauma.  Denies any back pain.  Denies any numbness or weakness.  Denies any urinary or bowel incontinence.  States that he broke his leg whenever he was 14.  Denies any other falls or trauma recently.  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  Works for [employer] and at [employer] and states he works approximately 70 hours a week.  No recent travel.  No fever or chills.     Physical Exam   Triage Vital Signs: ED Triage Vitals  Encounter Vitals Group     BP 03/20/23 2311 (!) 141/96     Systolic BP Percentile --      Diastolic BP Percentile --      Pulse Rate 03/20/23 2311 69     Resp 03/20/23 2311 18     Temp 03/20/23 2310 98.9 F (37.2 C)     Temp Source 03/20/23 2310 Oral     SpO2 03/20/23 2311 95 %     Weight 03/20/23 2310 212 lb (96.2 kg)     Height 03/20/23 2310 6\' 2"  (1.88 m)     Head Circumference --      Peak Flow --      Pain Score 03/20/23 2310 10     Pain Loc --      Pain Education --      Exclude from Growth Chart --     Most recent vital signs: Vitals:   03/20/23 2310 03/20/23 2311  BP:  (!) 141/96  Pulse:  69  Resp:  18  Temp: 98.9 F (37.2 C)   SpO2:  95%    Physical Exam Constitutional:      Appearance: He is well-developed.  HENT:     Head: Atraumatic.  Eyes:     Conjunctiva/sclera: Conjunctivae normal.  Cardiovascular:     Rate and Rhythm: Regular rhythm.  Pulmonary:     Effort: No respiratory distress.  Abdominal:     Tenderness: There is no abdominal  tenderness.  Musculoskeletal:     Cervical back: Normal range of motion.     Right lower leg: No edema.     Left lower leg: No edema.     Comments: Mild tenderness to palpation to the right thigh.  No overlying erythema warmth or induration.  No crepitance.  +2 DP and PT pulses that are equal bilaterally.  Good capillary refill.  Sensation intact to bilateral lower extremities.  5/5 strength bilateral lower extremities.  Able to fully flex and extend at hip, knee and ankle.  No midline thoracic or lumbar tenderness to palpation.  Able to ambulate but does limp on the right leg.  Skin:    General: Skin is warm.  Neurological:     Mental Status: He is alert. Mental status is at baseline.      IMPRESSION / MDM / ASSESSMENT AND PLAN / ED COURSE  I reviewed the triage vital signs and the nursing notes.  Differential diagnosis including  musculoskeletal strain, fracture, DVT, myositis  Low suspicion for radiculopathy.  No midline pain and no signs or symptoms concerning for cauda equina or epidural compression syndrome.  Do not feel that emergent MRI is necessary at this time.   RADIOLOGY I independently reviewed imaging, my interpretation of imaging: X-ray of the right leg with no acute fracture or dislocation.  Read is healing old fracture  Ultrasound of the right lower leg with no signs of DVT.  Read as no acute findings.   Labs (all labs ordered are listed, but only abnormal results are displayed) Labs interpreted as -    Labs Reviewed  CBC - Abnormal; Notable for the following components:      Result Value   MCV 73.5 (*)    MCH 23.4 (*)    All other components within normal limits  BASIC METABOLIC PANEL - Abnormal; Notable for the following components:   CO2 21 (*)    Glucose, Bld 107 (*)    All other components within normal limits  CK      Lab work overall reassuring with no significant leukocytosis.  Creatinine appears to be at baseline.  No significant electrolyte  abnormality.  CK within normal limits.  Patient was treated with IM ketorolac and Tylenol  On reevaluation patient continues to have no signs or symptoms concerning for an infectious process.  Clinical picture is not consistent with a septic joint.  Patient has soft compartments.  Low suspicion for compartment syndrome and no risk factors.  Does state that his pain had some improvement after treatment.  No findings of an infectious process.  Possibly musculoskeletal strain.  Able to fully extend the knee in extension without acute difficulties.  Patient has intact distal pulses with good capillary refill have a low suspicion for peripheral arterial disease or dissection.  Discussed symptomatic treatment.  Given information to follow-up closely with orthopedics and primary care provider.  Given return precautions for any worsening symptoms.   PROCEDURES:  Critical Care performed: No  Procedures  Patient's presentation is most consistent with acute presentation with potential threat to life or bodily function.   MEDICATIONS ORDERED IN ED: Medications  ketorolac (TORADOL) 30 MG/ML injection 30 mg (30 mg Intramuscular Given 03/21/23 0249)  acetaminophen (TYLENOL) tablet 1,000 mg (1,000 mg Oral Given 03/21/23 0249)    FINAL CLINICAL IMPRESSION(S) / ED DIAGNOSES   Final diagnoses:  Right leg pain     Rx / DC Orders   ED Discharge Orders          Ordered    oxyCODONE-acetaminophen (PERCOCET) 5-325 MG tablet  Every 6 hours PRN        03/21/23 0426             Note:  This document was prepared using Dragon voice recognition software and may include unintentional dictation errors.   Corena Herter, MD 03/21/23 [phone]

## 2023-03-26 ENCOUNTER — Ambulatory Visit: Payer: BC Managed Care – PPO | Admitting: Family Medicine

## 2023-03-26 ENCOUNTER — Encounter: Payer: Self-pay | Admitting: Family Medicine

## 2023-03-26 VITALS — BP 138/74 | HR 76 | Ht 74.0 in | Wt 212.0 lb

## 2023-03-26 DIAGNOSIS — G5781 Other specified mononeuropathies of right lower limb: Secondary | ICD-10-CM | POA: Diagnosis not present

## 2023-03-26 DIAGNOSIS — M5431 Sciatica, right side: Secondary | ICD-10-CM

## 2023-03-26 DIAGNOSIS — G5711 Meralgia paresthetica, right lower limb: Secondary | ICD-10-CM

## 2023-03-26 MED ORDER — PREDNISONE 20 MG PO TABS
ORAL_TABLET | ORAL | 0 refills | Status: DC
Start: 2023-03-26 — End: 2023-04-06

## 2023-03-26 MED ORDER — BACLOFEN 10 MG PO TABS
5.0000 mg | ORAL_TABLET | Freq: Three times a day (TID) | ORAL | 1 refills | Status: DC | PRN
Start: 2023-03-26 — End: 2023-04-06

## 2023-03-26 NOTE — Patient Instructions (Addendum)
Thank you for coming to the office today.  Pinched nerve entrapment of the leg  Meraglai paresthetica  Start Prednisone taper 7 days  Use baclofen as needed muscle relaxant.  If still not improved we can try nerve pill Gabapentin  Out of work 03/18/23 through 03/31/23, anticipated return to work 04/01/23  Return here 12/19  ------------------------  [facility]  Dr Joseph Berkshire  [facility] [address] [place], [place] [address] [phone]  Please schedule a Follow-up Appointment to: Return for 12/19 3pm.  If you have any other questions or concerns, please feel free to call the office or send a message through MyChart. You may also schedule an earlier appointment if necessary.  Additionally, you may be receiving a survey about your experience at our office within a few days to 1 week by e-mail or mail. We value your feedback.  Saralyn Pilar, DO [facility], [place]

## 2023-03-26 NOTE — Progress Notes (Addendum)
Subjective:    Patient ID: Samuel Davis, male    DOB: 06-12-1970, 52 y.o.   MRN: [id number]  Samuel Davis is a 52 y.o. male presenting on 03/26/2023 for Shoulder Pain (Right side, chronic, ) and Leg Pain (Right leg, quad pain 9/10, started a week ago)   HPI  Discussed the use of AI scribe software for clinical note transcription with the patient, who gave verbal consent to proceed.  The patient, with a history of right femur fracture in his teenage years, presented with sudden onset of right leg weakness and numbness. The symptoms began while the patient was seated at a restaurant, with no apparent trigger. The patient described the sensation as 'pins and needles' and reported difficulty in weight-bearing due to significant weakness. The patient also reported experiencing similar, albeit less severe, symptoms a couple of months prior while at work.  The patient sought immediate medical attention at an urgent care facility, where he received a Toradol injection. However, due to persistent severe pain, the patient presented to the emergency room approximately 12 hours later. The patient described the pain as localized to the front of the right thigh, with a peculiar sensation of numbness extending to the skin surface. The patient also reported a sensation of 'prickly numbness' in the same area.  At the hospital, the patient underwent an ultrasound and x-ray, which did not reveal any significant findings. The patient was advised that the symptoms might be due to a strained muscle or irritated nerve. The patient was discharged with prescriptions for ibuprofen and Percocet.  Despite taking the prescribed medications and employing various self-care measures such as using a knee brace, applying lidocaine, and taking magnesium supplements, the patient reported only minimal improvement in symptoms. The patient also reported difficulty in performing his job duties due to the persistent leg weakness and  pain. The patient expressed a desire to understand the cause of his symptoms and was open to further diagnostic evaluation.       ED FOLLOW-UP VISIT  Hospital/Location: [facility] Date of ED Visit: 03/21/23  Reason for Presenting to ED: Right Leg Pain  FOLLOW-UP  - ED provider note and record have been reviewed - Patient presents today about 5 days after recent ED visit. Brief summary of recent course, patient had symptoms of Right leg numbness onset while sitting down eating lunch. He admits gradual increasing exercise regimen prior to this event. He describes feeling some paresthesia pins and needles in Right lower extremity. He had sudden episode of RLE weakness and paresthesia. He went to urgent care, got a Toradol injection and then later 10+ hours went to [facility] ED. He described localized pain and paresthesia in RLE localized, not as much back pain.  History of prior traumatic injury as a teenager R femur fracture multiple places in same area. He had a pin and hardware in place.  Tried Magnesium, tried compression wrap, topical treatments.   - Today reports overall has done well after discharge from ED. Symptoms of R leg pain paresthesia have improved - New medications on discharge: Percocet - Changes to current meds on discharge: none   I have reviewed the discharge medication list, and have reconciled the current and discharge medications today.       02/13/2023    3:32 PM 10/18/2022    1:47 PM 06/26/2022   10:10 AM  Depression screen PHQ 2/9  Decreased Interest 0 0 1  Down, Depressed, Hopeless 0 0 1  PHQ - 2  Score 0 0 2  Altered sleeping 0 0 1  Tired, decreased energy 0 0 1  Change in appetite 0 0 1  Feeling bad or failure about yourself  0 0 0  Trouble concentrating 0 0 0  Moving slowly or fidgety/restless 0 0 0  Suicidal thoughts 0 0 0  PHQ-9 Score 0 0 5  Difficult doing work/chores Not difficult at all Not difficult at all Not difficult at all       02/13/2023     3:32 PM 10/18/2022    1:47 PM 06/26/2022   10:11 AM 02/23/2022    3:21 PM  GAD 7 : Generalized Anxiety Score  Nervous, Anxious, on Edge 0 0 0 0  Control/stop worrying 0 0 0 0  Worry too much - different things 0 0 0 0  Trouble relaxing 0 0 0 0  Restless 0 0 0 0  Easily annoyed or irritable 0 0 0 0  Afraid - awful might happen 0 0 0 0  Total GAD 7 Score 0 0 0 0  Anxiety Difficulty  Not difficult at all Not difficult at all Not difficult at all    Social History   Tobacco Use   Smoking status: Former    Current packs/day: 0.00    Average packs/day: 0.8 packs/day for 10.0 years (7.5 ttl pk-yrs)    Types: Cigarettes    Start date: 09/09/2007    Quit date: 09/08/2017    Years since quitting: 5.5   Smokeless tobacco: Former   Tobacco comments:    Quit cold [place]  Building services engineer status: Never Used  Substance Use Topics   Alcohol use: Not Currently    Alcohol/week: 7.0 standard drinks of alcohol    Types: 7 Cans of beer per week   Drug use: Never    Review of Systems Per HPI unless specifically indicated above     Objective:    Ht 6\' 2"  (1.88 m)   BMI 27.22 kg/m   Wt Readings from Last 3 Encounters:  03/20/23 212 lb (96.2 kg)  02/13/23 207 lb (93.9 kg)  01/16/23 201 lb (91.2 kg)    Physical Exam Vitals and nursing note reviewed.  Constitutional:      General: He is not in acute distress.    Appearance: Normal appearance. He is well-developed. He is not diaphoretic.     Comments: Well-appearing, comfortable, cooperative  HENT:     Head: Normocephalic and atraumatic.  Eyes:     General:        Right eye: No discharge.        Left eye: No discharge.     Conjunctiva/sclera: Conjunctivae normal.  Cardiovascular:     Rate and Rhythm: Normal rate.  Pulmonary:     Effort: Pulmonary effort is normal.  Musculoskeletal:     Comments: Right anterior lower extremity area of distribution is mid anterior lower thigh  Skin:    General: Skin is warm and dry.      Findings: No erythema or rash.  Neurological:     Mental Status: He is alert and oriented to person, place, and time.  Psychiatric:        Mood and Affect: Mood normal.        Behavior: Behavior normal.        Thought Content: Thought content normal.     Comments: Well groomed, good eye contact, normal speech and thoughts     I have personally reviewed the radiology  report from Right Femur X-ray  CLINICAL DATA:  Right thigh pain, history of remote fracture 30 years ago. Initial encounter   EXAM: RIGHT FEMUR 2 VIEWS   COMPARISON:  None Available.   FINDINGS: Mild curvature is noted in the midshaft of the right femur without acute fracture. This is likely related to healed fracture consistent with the patient's given clinical history. Degenerative changes of the knee joint are seen. No soft tissue abnormality is noted.   IMPRESSION: Mild curvature in the midportion of the right femur consistent with healed fracture.     Electronically Signed   By: Alcide Clever M.D.   On: 03/20/2023 23:35  -----------  I have personally reviewed the radiology report from 03/21/23 Doppler US.  Narrative & Impression  CLINICAL DATA:  Right leg pain   EXAM: RIGHT LOWER EXTREMITY VENOUS DOPPLER ULTRASOUND   TECHNIQUE: Gray-scale sonography with compression, as well as color and duplex ultrasound, were performed to evaluate the deep venous system(s) from the level of the common femoral vein through the popliteal and proximal calf veins.   COMPARISON:  None Available.   FINDINGS: VENOUS   Normal compressibility of the common femoral, superficial femoral, and popliteal veins, as well as the visualized calf veins. Visualized portions of profunda femoral vein and great saphenous vein unremarkable. No filling defects to suggest DVT on grayscale or color Doppler imaging. Doppler waveforms show normal direction of venous flow, normal respiratory plasticity and response  to augmentation.   Limited views of the contralateral common femoral vein are unremarkable.   OTHER   None.   Limitations: none   IMPRESSION: Negative.     Electronically Signed   By: Charlett Nose M.D.   On: 03/21/2023 03:30    Results for orders placed or performed during the hospital encounter of 03/21/23  CBC   Collection Time: 03/21/23  2:52 AM  Result Value Ref Range   WBC 5.2 4.0 - 10.5 K/uL   RBC 5.69 4.22 - 5.81 MIL/uL   Hemoglobin 13.3 13.0 - 17.0 g/dL   HCT 16.1 09.6 - 04.5 %   MCV 73.5 (L) 80.0 - 100.0 fL   MCH 23.4 (L) 26.0 - 34.0 pg   MCHC 31.8 30.0 - 36.0 g/dL   RDW 40.9 81.1 - 91.4 %   Platelets 256 150 - 400 K/uL   nRBC 0.0 0.0 - 0.2 %  Basic metabolic panel   Collection Time: 03/21/23  2:52 AM  Result Value Ref Range   Sodium 137 135 - 145 mmol/L   Potassium 4.1 3.5 - 5.1 mmol/L   Chloride 107 98 - 111 mmol/L   CO2 21 (L) 22 - 32 mmol/L   Glucose, Bld 107 (H) 70 - 99 mg/dL   BUN 16 6 - 20 mg/dL   Creatinine, Ser 7.82 0.61 - 1.24 mg/dL   Calcium 8.9 8.9 - 95.6 mg/dL   GFR, Estimated >21 >30 mL/min   Anion gap 9 5 - 15  CK   Collection Time: 03/21/23  2:52 AM  Result Value Ref Range   Total CK 166 49 - 397 U/L      Assessment & Plan:   Problem List Items Addressed This Visit   None Visit Diagnoses       Meralgia paresthetica of right side    -  Primary   Relevant Medications   predniSONE (DELTASONE) 20 MG tablet   baclofen (LIORESAL) 10 MG tablet   Other Relevant Orders   Ambulatory referral to Sports  Medicine         Right Leg Pain and Numbness Sudden onset of weakness, numbness, and pain in the right leg. No evidence of vascular or muscle damage from ER visit. Possible nerve entrapment, specifically Meralgia Paresthetica, given the location and nature of symptoms. -Start Prednisone taper for 1 week. -Start Baclofen as needed for muscle relaxation. -Defer Gabapentin, reconsider if symptoms persist. -Referral to Sports  Medicine for further evaluation and management. -Return to work anticipated on 04/09/2023. -Follow-up appointment on 04/06/2023.      Will complete paperwork for [facility] and [employer] for new continuous claim  1st Out of work 03/18/23 Anticipated return to work 04/09/23   Orders Placed This Encounter  Procedures   Ambulatory referral to Sports Medicine    Referral Priority:   Routine    Referral Type:   Consultation    Number of Visits Requested:   1    Meds ordered this encounter  Medications   predniSONE (DELTASONE) 20 MG tablet    Sig: Take daily with food. Start with 60mg  (3 pills) x 2 days, then reduce to 40mg  (2 pills) x 2 days, then 20mg  (1 pill) x 3 days    Dispense:  13 tablet    Refill:  0   baclofen (LIORESAL) 10 MG tablet    Sig: Take 0.5-1 tablets (5-10 mg total) by mouth 3 (three) times daily as needed for muscle spasms.    Dispense:  30 each    Refill:  1    Follow up plan: Return for 12/27 4pm.   Saralyn Pilar, DO [facility] 03/26/2023, 3:29 PM

## 2023-03-27 ENCOUNTER — Telehealth: Payer: Self-pay

## 2023-03-27 ENCOUNTER — Encounter: Payer: Self-pay | Admitting: Family Medicine

## 2023-03-27 NOTE — Telephone Encounter (Signed)
Does he mean the Continuous Leave FMLA Paperwork?  I did not write him a work note yesterday. I have his paperwork to fill out for his leave of absence for his Right leg pain.  We originally decided return 04/01/23. So, can you confirm - he wants me to update the paperwork to say out through 04/09/23 and return 04/10/23?  Saralyn Pilar, DO [facility] 03/27/2023, 10:19 AM

## 2023-03-27 NOTE — Telephone Encounter (Addendum)
Patient called to confirm that he would like to update his return to work date on his FMLA paperwork to 04/09/2023. Please advise.

## 2023-03-28 NOTE — Telephone Encounter (Signed)
Understood. I spoke with patient 12/17 in the evening regarding the paperwork   He will return to office next week to follow up with me and I will work on his paperwork with new date   Saralyn Pilar, DO [facility] 03/28/2023, 11:13 AM

## 2023-03-29 ENCOUNTER — Ambulatory Visit: Payer: BC Managed Care – PPO | Admitting: Family Medicine

## 2023-03-29 NOTE — Telephone Encounter (Signed)
Pt called in to f/u on MyChart message sent to Dr. Kirtland Bouchard this morning, 03/29/23, at 7:57 AM. Asked if the message had been sent to Dr. Kirtland Bouchard, they advised yes, stating the [employer] form is due tomorrow.  Please advise.

## 2023-03-29 NOTE — Telephone Encounter (Signed)
I have completed his [facility] Form and it has been scanned and faxed today.  Saralyn Pilar, DO [facility] 03/29/2023, 3:09 PM

## 2023-04-02 ENCOUNTER — Ambulatory Visit: Payer: BC Managed Care – PPO | Admitting: Family Medicine

## 2023-04-06 ENCOUNTER — Ambulatory Visit (INDEPENDENT_AMBULATORY_CARE_PROVIDER_SITE_OTHER): Payer: BC Managed Care – PPO | Admitting: Family Medicine

## 2023-04-06 ENCOUNTER — Encounter: Payer: Self-pay | Admitting: Family Medicine

## 2023-04-06 VITALS — BP 138/88 | HR 71 | Ht 74.0 in | Wt 210.0 lb

## 2023-04-06 DIAGNOSIS — M5431 Sciatica, right side: Secondary | ICD-10-CM

## 2023-04-06 DIAGNOSIS — G5781 Other specified mononeuropathies of right lower limb: Secondary | ICD-10-CM | POA: Diagnosis not present

## 2023-04-06 DIAGNOSIS — M51362 Other intervertebral disc degeneration, lumbar region with discogenic back pain and lower extremity pain: Secondary | ICD-10-CM | POA: Diagnosis not present

## 2023-04-06 DIAGNOSIS — G5711 Meralgia paresthetica, right lower limb: Secondary | ICD-10-CM

## 2023-04-06 DIAGNOSIS — M5441 Lumbago with sciatica, right side: Secondary | ICD-10-CM | POA: Diagnosis not present

## 2023-04-06 MED ORDER — BACLOFEN 10 MG PO TABS
5.0000 mg | ORAL_TABLET | Freq: Three times a day (TID) | ORAL | 2 refills | Status: DC | PRN
Start: 2023-04-06 — End: 2023-06-08

## 2023-04-06 NOTE — Progress Notes (Signed)
Subjective:    Patient ID: Samuel Davis. Samuel Davis, male    DOB: [birth year], 52 y.o.   MRN: [id number]  Samuel Davis. Samuel Davis is a 52 y.o. male presenting on 04/06/2023 for Back Pain   HPI  Discussed the use of AI scribe software for clinical note transcription with the patient, who gave verbal consent to proceed.  History of Present Illness     The patient, with a history of leg pain, presented for a follow-up visit after a recent ER visit.   Background information, initial visit for this current problem with [facility] ED 03/21/23. Seen by me on 03/26/23, ultimately thought more localized R leg nerve entrapment. But seems based on history and progression, more likely lumbar spine and nerve impingement.  The patient reported experiencing intermittent leg pain, which was exacerbated by walking and relieved by leaning on a cart while shopping. The pain was described as being more severe when walking at work compared to walking around the house. The patient also reported that the pain was predominantly on the right side and was more intense in the lower leg.  Previously, The patient had been prescribed a course of prednisone and a muscle relaxant, Baclofen, which he reported as helpful. The patient also mentioned trying various exercises found online to alleviate the pain, suggesting a possible nerve entrapment or disc issue in the spine.  He works multiple jobs and for main job he was taken out on FMLA Continuous since 03/19/23 through 04/08/23 (return date)  The patient had been referred to a sports medicine specialist but had not yet scheduled an appointment at the time of this visit.          04/06/2023    3:55 PM 02/13/2023    3:32 PM 10/18/2022    1:47 PM  Depression screen PHQ 2/9  Decreased Interest 0 0 0  Down, Depressed, Hopeless 0 0 0  PHQ - 2 Score 0 0 0  Altered sleeping  0 0  Tired, decreased energy  0 0  Change in appetite  0 0  Feeling bad or failure about yourself   0 0  Trouble  concentrating  0 0  Moving slowly or fidgety/restless  0 0  Suicidal thoughts  0 0  PHQ-9 Score  0 0  Difficult doing work/chores  Not difficult at all Not difficult at all       04/06/2023    3:55 PM 02/13/2023    3:32 PM 10/18/2022    1:47 PM 06/26/2022   10:11 AM  GAD 7 : Generalized Anxiety Score  Nervous, Anxious, on Edge 0 0 0 0  Control/stop worrying 0 0 0 0  Worry too much - different things 0 0 0 0  Trouble relaxing 0 0 0 0  Restless 0 0 0 0  Easily annoyed or irritable 0 0 0 0  Afraid - awful might happen 0 0 0 0  Total GAD 7 Score 0 0 0 0  Anxiety Difficulty   Not difficult at all Not difficult at all    Social History   Tobacco Use   Smoking status: Former    Current packs/day: 0.00    Average packs/day: 0.8 packs/day for 10.0 years (7.5 ttl pk-yrs)    Types: Cigarettes    Start date: 09/09/2007    Quit date: 09/08/2017    Years since quitting: 5.5   Smokeless tobacco: Former   Tobacco comments:    Quit cold [place]  Building services engineer  status: Never Used  Substance Use Topics   Alcohol use: Not Currently    Alcohol/week: 7.0 standard drinks of alcohol    Types: 7 Cans of beer per week   Drug use: Never    Review of Systems Per HPI unless specifically indicated above     Objective:    BP (!) 150/90   Pulse 71   Ht 6\' 2"  (1.88 m)   Wt 210 lb (95.3 kg)   SpO2 100%   BMI 26.96 kg/m   Wt Readings from Last 3 Encounters:  04/06/23 210 lb (95.3 kg)  03/26/23 212 lb (96.2 kg)  03/20/23 212 lb (96.2 kg)    Physical Exam Vitals and nursing note reviewed.  Constitutional:      General: He is not in acute distress.    Appearance: Normal appearance. He is well-developed. He is not diaphoretic.     Comments: Well-appearing, comfortable, cooperative  HENT:     Head: Normocephalic and atraumatic.  Eyes:     General:        Right eye: No discharge.        Left eye: No discharge.     Conjunctiva/sclera: Conjunctivae normal.  Cardiovascular:     Rate  and Rhythm: Normal rate.  Pulmonary:     Effort: Pulmonary effort is normal.  Musculoskeletal:     Comments: Range of motion intact lumbar spine, some provoked symptoms with back extension  Skin:    General: Skin is warm and dry.     Findings: No erythema or rash.  Neurological:     Mental Status: He is alert and oriented to person, place, and time.  Psychiatric:        Mood and Affect: Mood normal.        Behavior: Behavior normal.        Thought Content: Thought content normal.     Comments: Well groomed, good eye contact, normal speech and thoughts     Results for orders placed or performed during the hospital encounter of 03/21/23  CBC   Collection Time: 03/21/23  2:52 AM  Result Value Ref Range   WBC 5.2 4.0 - 10.5 K/uL   RBC 5.69 4.22 - 5.81 MIL/uL   Hemoglobin 13.3 13.0 - 17.0 g/dL   HCT 91.4 78.2 - 95.6 %   MCV 73.5 (L) 80.0 - 100.0 fL   MCH 23.4 (L) 26.0 - 34.0 pg   MCHC 31.8 30.0 - 36.0 g/dL   RDW 21.3 08.6 - 57.8 %   Platelets 256 150 - 400 K/uL   nRBC 0.0 0.0 - 0.2 %  Basic metabolic panel   Collection Time: 03/21/23  2:52 AM  Result Value Ref Range   Sodium 137 135 - 145 mmol/L   Potassium 4.1 3.5 - 5.1 mmol/L   Chloride 107 98 - 111 mmol/L   CO2 21 (L) 22 - 32 mmol/L   Glucose, Bld 107 (H) 70 - 99 mg/dL   BUN 16 6 - 20 mg/dL   Creatinine, Ser 4.69 0.61 - 1.24 mg/dL   Calcium 8.9 8.9 - 62.9 mg/dL   GFR, Estimated >52 >84 mL/min   Anion gap 9 5 - 15  CK   Collection Time: 03/21/23  2:52 AM  Result Value Ref Range   Total CK 166 49 - 397 U/L      Assessment & Plan:   Problem List Items Addressed This Visit   None Visit Diagnoses       Acute right-sided  low back pain with right-sided sciatica    -  Primary   Relevant Medications   baclofen (LIORESAL) 10 MG tablet   Other Relevant Orders   DG Lumbar Spine Complete     Right sided sciatica       Relevant Medications   baclofen (LIORESAL) 10 MG tablet     Meralgia paresthetica of right side        Relevant Medications   baclofen (LIORESAL) 10 MG tablet     Nerve entrapment of lower limb, right       Relevant Medications   baclofen (LIORESAL) 10 MG tablet        Lower Back Pain with Radiculopathy Pain exacerbated by walking and relieved by leaning on objects. Suspected nerve entrapment or disc impingement. Patient has been self-managing with exercises found online.  Improvement but not having resolution of symptoms He has remained out of work since 03/19/23, on continuous FMLA leave, return to work date is scheduled 04/08/23  Ultimately he was unable to arrange Sports Medicine referral, they contacted him but he did not schedule yet.  -Order STAT lumbar spine x-ray to be done on Monday. -Curbside Dr Ashley Royalty today Sports Medicine for advice -Plan for MRI Lumbar spine based on x-ray results. -Consider referral to pain management or neurosurgery spine specialty based on MRI results. -Increase Baclofen to 2-3 times per day as tolerated. -Provide work excuse note for today's appointment.  -Check x-ray results on MyChart. -Continue to monitor symptoms and return to clinic if condition worsens or if further paperwork is needed for work extension of absence or new claim with new information        Orders Placed This Encounter  Procedures   DG Lumbar Spine Complete    Standing Status:   Future    Expiration Date:   04/05/2024    Reason for Exam (SYMPTOM  OR DIAGNOSIS REQUIRED):   low back pain R sciatica    Preferred imaging location?:   [facility]    Meds ordered this encounter  Medications   baclofen (LIORESAL) 10 MG tablet    Sig: Take 0.5-1 tablets (5-10 mg total) by mouth 3 (three) times daily as needed for muscle spasms.    Dispense:  60 each    Refill:  2    Follow up plan: Return if symptoms worsen or fail to improve.    Saralyn Pilar, DO [facility] 04/06/2023, 4:15 PM

## 2023-04-06 NOTE — Patient Instructions (Addendum)
Thank you for coming to the office today.  X-ray ordered for Lumbar Spine low back, here at [facility] Walk in, no apt, Mon-Thurs 8-11, and 1-4 approximately I ordered it STAT / rushed, so once you do the X-ray we should get results later that day.  Increase Baclofen muscle relaxant to 2-3 times per day as tolerated / as needed  Based on X-ray, we can come to a conclusion if we need to refer Pain  Management or Neurosurgery Spine Specialty  If we cannot get MRI then we can refer to Sports Med again  -----------------------  [facility] Address: [address] [place], [place] [address] Phone: [phone]  Dr Derrill Center  [facility] at [facility] [address] [place],  [place]  [address] Main: [phone] Fax: [phone]   Please schedule a Follow-up Appointment to: Return if symptoms worsen or fail to improve.  If you have any other questions or concerns, please feel free to call the office or send a message through MyChart. You may also schedule an earlier appointment if necessary.  Additionally, you may be receiving a survey about your experience at our office within a few days to 1 week by e-mail or mail. We value your feedback.  Saralyn Pilar, DO [facility], [place]

## 2023-05-15 ENCOUNTER — Ambulatory Visit: Payer: Self-pay | Admitting: *Deleted

## 2023-05-15 NOTE — Telephone Encounter (Signed)
 Reason for Disposition  [1] SEVERE pain (e.g., excruciating) AND [2] present > 1 hour  Answer Assessment - Initial Assessment Questions 1. LOCATION: Where does it hurt?      Midline- middle of abdomen 2. RADIATION: Does the pain shoot anywhere else? (e.g., chest, back)     Stays in the middle 3. ONSET: When did the pain begin? (Minutes, hours or days ago)      Started this morning 4. SUDDEN: Gradual or sudden onset?     sudden 5. PATTERN Does the pain come and go, or is it constant?    - If it comes and goes: How long does it last? Do you have pain now?     (Note: Comes and goes means the pain is intermittent. It goes away completely between bouts.)    - If constant: Is it getting better, staying the same, or getting worse?      (Note: Constant means the pain never goes away completely; most serious pain is constant and gets worse.)      constant 6. SEVERITY: How bad is the pain?  (e.g., Scale 1-10; mild, moderate, or severe)    - MILD (1-3): Doesn't interfere with normal activities, abdomen soft and not tender to touch.     - MODERATE (4-7): Interferes with normal activities or awakens from sleep, abdomen tender to touch.     - SEVERE (8-10): Excruciating pain, doubled over, unable to do any normal activities.       severe 7. RECURRENT SYMPTOM: Have you ever had this type of stomach pain before? If Yes, ask: When was the last time? and What happened that time?      no 8. CAUSE: What do you think is causing the stomach pain?     Not sure- ate salad this morning 9. RELIEVING/AGGRAVATING FACTORS: What makes it better or worse? (e.g., antacids, bending or twisting motion, bowel movement)     no 10. OTHER SYMPTOMS: Do you have any other symptoms? (e.g., back pain, diarrhea, fever, urination pain, vomiting)       Bowel movement, vomiting  Protocols used: Abdominal Pain - Male-A-AH

## 2023-05-15 NOTE — Telephone Encounter (Signed)
  Chief Complaint: severe abdominal pain Symptoms: vomiting, pain-8/10, midline Frequency: constant pain since this am Pertinent Negatives: Patient denies bowel changes Disposition: [x] ED /[] Urgent Care (no appt availability in office) / [] Appointment(In office/virtual)/ []  [facility]/ [] Home Care/ [] Refused Recommended Disposition /[] [facility]/ []  Follow-up with PCP Additional Notes: patient advised ED per protocol- severe pain

## 2023-06-08 ENCOUNTER — Ambulatory Visit (INDEPENDENT_AMBULATORY_CARE_PROVIDER_SITE_OTHER): Payer: BC Managed Care – PPO | Admitting: Family Medicine

## 2023-06-08 ENCOUNTER — Encounter: Payer: Self-pay | Admitting: Family Medicine

## 2023-06-08 VITALS — BP 138/80 | HR 72 | Ht 74.0 in | Wt 223.0 lb

## 2023-06-08 DIAGNOSIS — M51362 Other intervertebral disc degeneration, lumbar region with discogenic back pain and lower extremity pain: Secondary | ICD-10-CM

## 2023-06-08 MED ORDER — BACLOFEN 10 MG PO TABS
5.0000 mg | ORAL_TABLET | Freq: Three times a day (TID) | ORAL | 2 refills | Status: DC | PRN
Start: 2023-06-08 — End: 2023-10-26

## 2023-06-08 MED ORDER — NAPROXEN 500 MG PO TABS
500.0000 mg | ORAL_TABLET | Freq: Two times a day (BID) | ORAL | 2 refills | Status: DC
Start: 2023-06-08 — End: 2023-10-26

## 2023-06-08 NOTE — Progress Notes (Signed)
 Subjective:    Patient ID: Samuel Davis. Samuel Davis, male    DOB: Jul 14, 1970, 53 y.o.   MRN: [id number]  Samuel Davis is a 53 y.o. male presenting on 06/08/2023 for Back Pain  Patient presents for a same day appointment.   HPI  Discussed the use of AI scribe software for clinical note transcription with the patient, who gave verbal consent to proceed.  History of Present Illness   Samuel Davis is a 53 year old male who presents with acute lower back pain.  He has been experiencing significant lower back pain for the past two days. The pain is persistent, localized across the lower back, and does not radiate down the legs. It is exacerbated by bending forward and prolonged standing, while sitting and lying down provide some relief. No recent injury or specific incident that could have triggered the pain.  He has a history of similar back issues, including sciatica and lumbar pain, which were discussed in previous visits. However, he notes that the current pain feels different from past episodes. Previously, an x-ray was ordered to investigate potential nerve pinching sciatica, but it was not completed as the pain subsided at that time.  For the current episode, he has been taking ibuprofen 200 mg, three to four times a day, but reports limited relief. He previously used baclofen, a muscle relaxant, but has run out and has not tried it for this specific pain. He recalls that ibuprofen 800 mg was prescribed in the past for nerve-related pain, and he has used it all.  He works in a physically demanding job, loading trucks, which he believes contributes to his back pain. 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 He has reduced his work hours due to ongoing back pain issues.  No sciatica symptoms with the current episode of back pain.            06/08/2023   11:33 AM 04/06/2023    3:55 PM 02/13/2023    3:32 PM  Depression screen PHQ 2/9  Decreased Interest 0 0 0   Down, Depressed, Hopeless 0 0 0  PHQ - 2 Score 0 0 0  Altered sleeping   0  Tired, decreased energy   0  Change in appetite   0  Feeling bad or failure about yourself    0  Trouble concentrating   0  Moving slowly or fidgety/restless   0  Suicidal thoughts   0  PHQ-9 Score   0  Difficult doing work/chores   Not difficult at all       06/08/2023   11:33 AM 04/06/2023    3:55 PM 02/13/2023    3:32 PM 10/18/2022    1:47 PM  GAD 7 : Generalized Anxiety Score  Nervous, Anxious, on Edge 0 0 0 0  Control/stop worrying 0 0 0 0  Worry too much - different things 0 0 0 0  Trouble relaxing 0 0 0 0  Restless 0 0 0 0  Easily annoyed or irritable 0 0 0 0  Afraid - awful might happen 0 0 0 0  Total GAD 7 Score 0 0 0 0  Anxiety Difficulty    Not difficult at all    Social History   Tobacco Use   Smoking status: Former    Current packs/day: 0.00    Average packs/day: 0.8 packs/day for 10.0 years (7.5 ttl pk-yrs)    Types: Cigarettes    Start  date: 09/09/2007    Quit date: 09/08/2017    Years since quitting: 5.7   Smokeless tobacco: Former   Tobacco comments:    Quit cold [place]  Building services engineer status: Never Used  Substance Use Topics   Alcohol use: Not Currently    Alcohol/week: 7.0 standard drinks of alcohol    Types: 7 Cans of beer per week   Drug use: Never    Review of Systems Per HPI unless specifically indicated above     Objective:    BP 138/80 (BP Location: Left Arm, Cuff Size: Normal)   Pulse 72   Ht 6\' 2"  (1.88 m)   Wt 223 lb (101.2 kg)   SpO2 98%   BMI 28.63 kg/m   Wt Readings from Last 3 Encounters:  06/08/23 223 lb (101.2 kg)  04/06/23 210 lb (95.3 kg)  03/26/23 212 lb (96.2 kg)    Physical Exam Vitals and nursing note reviewed.  Constitutional:      General: He is not in acute distress.    Appearance: Normal appearance. He is well-developed. He is not diaphoretic.     Comments: Well-appearing, comfortable, cooperative  HENT:     Head:  Normocephalic and atraumatic.  Eyes:     General:        Right eye: No discharge.        Left eye: No discharge.     Conjunctiva/sclera: Conjunctivae normal.  Cardiovascular:     Rate and Rhythm: Normal rate.  Pulmonary:     Effort: Pulmonary effort is normal.  Musculoskeletal:     Comments: Lumbar with localized paraspinal muscle hypertonicity spasm, some reduced forward bending.  Skin:    General: Skin is warm and dry.     Findings: No erythema or rash.  Neurological:     Mental Status: He is alert and oriented to person, place, and time.  Psychiatric:        Mood and Affect: Mood normal.        Behavior: Behavior normal.        Thought Content: Thought content normal.     Comments: Well groomed, good eye contact, normal speech and thoughts     Results for orders placed or performed during the hospital encounter of 03/21/23  CBC   Collection Time: 03/21/23  2:52 AM  Result Value Ref Range   WBC 5.2 4.0 - 10.5 K/uL   RBC 5.69 4.22 - 5.81 MIL/uL   Hemoglobin 13.3 13.0 - 17.0 g/dL   HCT 25.9 56.3 - 87.5 %   MCV 73.5 (L) 80.0 - 100.0 fL   MCH 23.4 (L) 26.0 - 34.0 pg   MCHC 31.8 30.0 - 36.0 g/dL   RDW 64.3 32.9 - 51.8 %   Platelets 256 150 - 400 K/uL   nRBC 0.0 0.0 - 0.2 %  Basic metabolic panel   Collection Time: 03/21/23  2:52 AM  Result Value Ref Range   Sodium 137 135 - 145 mmol/L   Potassium 4.1 3.5 - 5.1 mmol/L   Chloride 107 98 - 111 mmol/L   CO2 21 (L) 22 - 32 mmol/L   Glucose, Bld 107 (H) 70 - 99 mg/dL   BUN 16 6 - 20 mg/dL   Creatinine, Ser 8.41 0.61 - 1.24 mg/dL   Calcium 8.9 8.9 - 66.0 mg/dL   GFR, Estimated >63 >01 mL/min   Anion gap 9 5 - 15  CK   Collection Time: 03/21/23  2:52 AM  Result  Value Ref Range   Total CK 166 49 - 397 U/L      Assessment & Plan:   Problem List Items Addressed This Visit   None Visit Diagnoses       Degeneration of intervertebral disc of lumbar region with discogenic back pain and lower extremity pain    -  Primary    Relevant Medications   baclofen (LIORESAL) 10 MG tablet   naproxen (NAPROSYN) 500 MG tablet        Acute Lower Back Pain Persistent, localized pain across the lower back for the past two days, possible onset earlier this week. No radiating pain or sciatica symptoms. Pain exacerbated by bending and standing for prolonged periods. Limited relief with ibuprofen. Possible muscle or joint involvement. No obvious sciatica now Known prior back pain lumbar with sciatica - has since resolved, this was back in Dec 2024  -Order Naproxen 500mg  TWICE A DAY for anti inflammatory for pain relief. -Stop Ibuprofen OTC -Order refill of Baclofen for muscle relaxation trial  -Recommend patient to have lumbar x-ray (previously ordered) to investigate potential causes of pain. Return next week for X-ray  -Consider referral to sports/spine/ortho specialist if pain persists or worsens.         No orders of the defined types were placed in this encounter.   Meds ordered this encounter  Medications   baclofen (LIORESAL) 10 MG tablet    Sig: Take 0.5-1 tablets (5-10 mg total) by mouth 3 (three) times daily as needed for muscle spasms.    Dispense:  60 each    Refill:  2   naproxen (NAPROSYN) 500 MG tablet    Sig: Take 1 tablet (500 mg total) by mouth 2 (two) times daily with a meal. For 2-4 weeks then as needed    Dispense:  60 tablet    Refill:  2    Follow up plan: Return if symptoms worsen or fail to improve.    Saralyn Pilar, DO [facility] 06/08/2023, 11:20 AM

## 2023-06-08 NOTE — Patient Instructions (Addendum)
 Thank you for coming to the office today.  For your Back Pain - I think that this is due to Muscle Spasms or strain.   May use Tylenol Extra Str 500mg  tabs - may take 1-2 tablets every 6 hours as needed  Recommend to start using heating pad on your lower back 1-2x daily for few weeks  Also try a Wedge Seat Cushion to avoid nerve pinching when sitting prolonged period of time.  This pain may take weeks to months to fully resolve, but hopefully it will respond to the medicine initially. All back injuries (small or serious) are slow to heal since we use our back muscles every day. Be careful with turning, twisting, lifting, sitting / standing for prolonged periods, and avoid re-injury.  If your symptoms significantly worsen with more pain, or new symptoms with weakness in one or both legs, new or different shooting leg pains, numbness in legs or groin, loss of control or retention of urine or bowel movements, please call back for advice and you may need to go directly to the Emergency Department.  X-ray anytime Mon-Thurs 8-4  Recommend trial of Anti-inflammatory with Naproxen (Naprosyn) 500mg  tabs - take one with food and plenty of water TWICE daily every day (breakfast and dinner), for next 2 to 4 weeks, then you may take only as needed - DO NOT TAKE any ibuprofen, aleve, motrin while you are taking this medicine - It is safe to take Tylenol Ext Str 500mg  tabs - take 1 to 2 (max dose 1000mg ) every 6 hours as needed for breakthrough pain, max 24 hour daily dose is 6 to 8 tablets or 4000mg    Start taking Baclofen (Lioresal) 10mg  (muscle relaxant) - start with half (cut) to one whole pill at night as needed for next 1-3 nights (may make you drowsy, caution with driving) see how it affects you, then if tolerated increase to one pill 2 to 3 times a day or (every 8 hours as needed)  Please schedule a Follow-up Appointment to: Return if symptoms worsen or fail to improve.  If you have any other  questions or concerns, please feel free to call the office or send a message through MyChart. You may also schedule an earlier appointment if necessary.  Additionally, you may be receiving a survey about your experience at our office within a few days to 1 week by e-mail or mail. We value your feedback.  Saralyn Pilar, DO [facility], [facility]             Low Back Pain Exercises  See other page with pictures of each exercise.  Start with 1 or 2 of these exercises that you are most comfortable with. Do not do any exercises that cause you significant worsening pain. Some of these may cause some "stretching soreness" but it should go away after you stop the exercise, and get better over time. Gradually increase up to 3-4 exercises as tolerated.  Standing hamstring stretch: Place the heel of your leg on a stool about 15 inches high. Keep your knee straight. Lean forward, bending at the hips until you feel a mild stretch in the back of your thigh. Make sure you do not roll your shoulders and bend at the waist when doing this or you will stretch your lower back instead. Hold the stretch for 15 to 30 seconds. Repeat 3 times. Repeat the same stretch on your other leg.  Cat and camel: Get down on your hands and knees.  Let your stomach sag, allowing your back to curve downward. Hold this position for 5 seconds. Then arch your back and hold for 5 seconds. Do 3 sets of 10.  Quadriped Arm/Leg Raises: Get down on your hands and knees. Tighten your abdominal muscles to stiffen your spine. While keeping your abdominals tight, raise one arm and the opposite leg away from you. Hold this position for 5 seconds. Lower your arm and leg slowly and alternate sides. Do this 10 times on each side.  Pelvic tilt: Lie on your back with your knees bent and your feet flat on the floor. Tighten your abdominal muscles and push your lower back into the floor. Hold this position for 5 seconds, then  relax. Do 3 sets of 10.  Partial curl: Lie on your back with your knees bent and your feet flat on the floor. Tighten your stomach muscles and flatten your back against the floor. Tuck your chin to your chest. With your hands stretched out in front of you, curl your upper body forward until your shoulders clear the floor. Hold this position for 3 seconds. Don't hold your breath. It helps to breathe out as you lift your shoulders up. Relax. Repeat 10 times. Build to 3 sets of 10. To challenge yourself, clasp your hands behind your head and keep your elbows out to the side.  Lower trunk rotation: Lie on your back with your knees bent and your feet flat on the floor. Tighten your abdominal muscles and push your lower back into the floor. Keeping your shoulders down flat, gently rotate your legs to one side, then the other as far as you can. Repeat 10 to 20 times.  Single knee to chest stretch: Lie on your back with your legs straight out in front of you. Bring one knee up to your chest and grasp the back of your thigh. Pull your knee toward your chest, stretching your buttock muscle. Hold this position for 15 to 30 seconds and return to the starting position. Repeat 3 times on each side.  Double knee to chest: Lie on your back with your knees bent and your feet flat on the floor. Tighten your abdominal muscles and push your lower back into the floor. Pull both knees up to your chest. Hold for 5 seconds and repeat 10 to 20 times.

## 2023-06-12 ENCOUNTER — Ambulatory Visit: Admitting: Family Medicine

## 2023-06-13 ENCOUNTER — Ambulatory Visit: Admitting: Family Medicine

## 2023-06-14 ENCOUNTER — Encounter: Payer: Self-pay | Admitting: Family Medicine

## 2023-06-14 ENCOUNTER — Ambulatory Visit: Admitting: Family Medicine

## 2023-06-14 VITALS — BP 120/82 | HR 80 | Ht 74.0 in | Wt 218.0 lb

## 2023-06-14 DIAGNOSIS — M7551 Bursitis of right shoulder: Secondary | ICD-10-CM | POA: Diagnosis not present

## 2023-06-14 DIAGNOSIS — G8929 Other chronic pain: Secondary | ICD-10-CM

## 2023-06-14 DIAGNOSIS — M25511 Pain in right shoulder: Secondary | ICD-10-CM | POA: Diagnosis not present

## 2023-06-14 NOTE — Progress Notes (Signed)
 Subjective:    Patient ID: Samuel Davis. Samuel Davis, male    DOB: 16-Jan-1971, 53 y.o.   MRN: [id number]  Samuel Davis is a 53 y.o. male presenting on 06/14/2023 for Shoulder Pain   HPI  Discussed the use of AI scribe software for clinical note transcription with the patient, who gave verbal consent to proceed.  History of Present Illness   [name] is a 53 year old male who presents for FMLA paperwork related to right shoulder issues.  The patient, with a history of right shoulder tendinitis, arthritis, and chronic pain, presents for a renewal of their FMLA paperwork.    Last visit for this same problem with me was 02/13/23. See prior note for background info.   Reports chronic issue with episodic flares R shoulder pain. He has seen the [facility] specialist back in 12/2020 and had X-rays and diagnosed with arthritis and bursitis, symptoms worse at night, difficulty laying on R shoulder. Worse with repetitive activity. He has tried NSAID oral PRN with some temporary relief but decided to avoid nsaid longer term.   They have been experiencing episodic flare-ups of their shoulder condition, which limit their ability to lift and reach, impacting their job performance.   Additional dates missed requesting coverage due to flare up = 06/04/23, 06/10/23 and 06/11/23  The patient manages their symptoms with over-the-counter pain medication and topical ointment application. They have previously tried muscle relaxants such as baclofen   He has pursued Physical Therapy previously   He does work involving reaching above shoulder for work, often lifting and reaching from that height. Can lift up to 50 lbs at work.   Has not had traumatic injury on R shoulder. No prior injection or surgery.   He has had episodic flares that interfere with his ability to lift or reach overhead and keep him out of work. He has benefit from [facility] intermittent absence due to flare ups and doctors apt.   When he is not  having flare he is able to work. But if flared up, shoulder prevents him from working. He is having increased frequency of shoulder pain flare up that will require additional time out of work in future. Based on the chronic recurrent nature of this problem he has had more frequent flares.        06/14/2023    9:19 AM 06/08/2023   11:33 AM 04/06/2023    3:55 PM  Depression screen PHQ 2/9  Decreased Interest 0 0 0  Down, Depressed, Hopeless 0 0 0  PHQ - 2 Score 0 0 0  Altered sleeping 0    Tired, decreased energy 0    Change in appetite 0    Feeling bad or failure about yourself  0    Trouble concentrating 0    Moving slowly or fidgety/restless 0    Suicidal thoughts 0    PHQ-9 Score 0    Difficult doing work/chores Not difficult at all         06/14/2023    9:19 AM 06/08/2023   11:33 AM 04/06/2023    3:55 PM 02/13/2023    3:32 PM  GAD 7 : Generalized Anxiety Score  Nervous, Anxious, on Edge 0 0 0 0  Control/stop worrying 0 0 0 0  Worry too much - different things 0 0 0 0  Trouble relaxing 0 0 0 0  Restless 0 0 0 0  Easily annoyed or irritable 0 0 0 0  Afraid -  awful might happen 0 0 0 0  Total GAD 7 Score 0 0 0 0  Anxiety Difficulty Not difficult at all       Social History   Tobacco Use   Smoking status: Former    Current packs/day: 0.00    Average packs/day: 0.8 packs/day for 10.0 years (7.5 ttl pk-yrs)    Types: Cigarettes    Start date: 09/09/2007    Quit date: 09/08/2017    Years since quitting: 5.7   Smokeless tobacco: Former   Tobacco comments:    Quit cold [place]  Building services engineer status: Never Used  Substance Use Topics   Alcohol use: Not Currently    Alcohol/week: 7.0 standard drinks of alcohol    Types: 7 Cans of beer per week   Drug use: Never    Review of Systems Per HPI unless specifically indicated above     Objective:    BP 120/82   Pulse 80   Ht 6\' 2"  (1.88 m)   Wt 218 lb (98.9 kg)   SpO2 96%   BMI 27.99 kg/m   Wt Readings from Last  3 Encounters:  06/14/23 218 lb (98.9 kg)  06/08/23 223 lb (101.2 kg)  04/06/23 210 lb (95.3 kg)    Physical Exam Vitals and nursing note reviewed.  Constitutional:      General: He is not in acute distress.    Appearance: He is well-developed. He is not diaphoretic.     Comments: Well-appearing, comfortable, cooperative  HENT:     Head: Normocephalic and atraumatic.  Eyes:     General:        Right eye: No discharge.        Left eye: No discharge.     Conjunctiva/sclera: Conjunctivae normal.  Neck:     Thyroid: No thyromegaly.  Cardiovascular:     Rate and Rhythm: Normal rate and regular rhythm.     Pulses: Normal pulses.     Heart sounds: Normal heart sounds. No murmur heard. Pulmonary:     Effort: Pulmonary effort is normal. No respiratory distress.     Breath sounds: Normal breath sounds. No wheezing or rales.  Musculoskeletal:     Cervical back: Normal range of motion and neck supple.     Comments: RIGHT Shoulder Inspection: Normal appearance bilateral symmetrical Palpation: Non-tender to palpation over anterior, lateral, or posterior shoulder  ROM: Improved but still reduced active ROM abduction limited above shoulder level, forward flexion intact. Some reduced internal rotation still. Special Testing: Rotator cuff testing negative for weakness with supraspinatus full can and empty can test, Impingement testing POSITIVE R shoulder provoked symptoms. Strength: Normal strength 5/5 flex/ext, ext rot / int rot, grip, rotator cuff str testing. Neurovascular: Distally intact pulses, sensation to light touch  Lymphadenopathy:     Cervical: No cervical adenopathy.  Skin:    General: Skin is warm and dry.     Findings: No erythema or rash.  Neurological:     Mental Status: He is alert and oriented to person, place, and time. Mental status is at baseline.  Psychiatric:        Behavior: Behavior normal.     Comments: Well groomed, good eye contact, normal speech and thoughts      Results for orders placed or performed during the hospital encounter of 03/21/23  CBC   Collection Time: 03/21/23  2:52 AM  Result Value Ref Range   WBC 5.2 4.0 - 10.5 K/uL   RBC  5.69 4.22 - 5.81 MIL/uL   Hemoglobin 13.3 13.0 - 17.0 g/dL   HCT 36.6 44.0 - 34.7 %   MCV 73.5 (L) 80.0 - 100.0 fL   MCH 23.4 (L) 26.0 - 34.0 pg   MCHC 31.8 30.0 - 36.0 g/dL   RDW 42.5 95.6 - 38.7 %   Platelets 256 150 - 400 K/uL   nRBC 0.0 0.0 - 0.2 %  Basic metabolic panel   Collection Time: 03/21/23  2:52 AM  Result Value Ref Range   Sodium 137 135 - 145 mmol/L   Potassium 4.1 3.5 - 5.1 mmol/L   Chloride 107 98 - 111 mmol/L   CO2 21 (L) 22 - 32 mmol/L   Glucose, Bld 107 (H) 70 - 99 mg/dL   BUN 16 6 - 20 mg/dL   Creatinine, Ser 5.64 0.61 - 1.24 mg/dL   Calcium 8.9 8.9 - 33.2 mg/dL   GFR, Estimated >95 >18 mL/min   Anion gap 9 5 - 15  CK   Collection Time: 03/21/23  2:52 AM  Result Value Ref Range   Total CK 166 49 - 397 U/L      Assessment & Plan:   Problem List Items Addressed This Visit   None Visit Diagnoses       Chronic right shoulder pain    -  Primary     Chronic bursitis of right shoulder            Right Shoulder Tendinitis and Arthritis   Consistent with RIGHT-shoulder bursitis vs rotator cuff tendinopathy with some reduced active ROM but without significant evidence of muscle tear (no weakness).  Known repetitive overhead/strenuous activity as likely etiology  53 yr old patient with likely underlying arthritis Imaging per Orthopedics ([facility] 12/2020)   He continues to have episodic flare up of R shoulder pain due to work and this has limited his ability to work during flares requiring additional time off for flares.   Plan: 1. Continue anti inflammatory topical - OTC Voltaren (generic Diclofenac) topical 2-4 times a day as needed for pain swelling of affected joint for 1-2 weeks or longer. - Cannot tolerate oral NSAIDs 2. Continue Baclofen muscle relaxant  5-10mg  QHS PRN caution sedation 3. May take Tylenol Ex Str 1-2 q 6 hr PRN 4. Relative rest but keep shoulder mobile, demonstrated ROM exercises, avoid heavy lifting 5. May try heating pad PRN   If still not improving and needs further management, we can pursue future follow-up with Orthopedics considering cortisone injection in joint / guided Physical Therapy or other procedural intervention if not improving.   Updated Renewal existing [employer] case # [id number] FMLA Intermittent Leave  No work restrictions given today. When not having flare up, he can perform all tasks.   Intermittent only  Renewal Leave Start Date = 06/04/23 Renewal Leave End Date = 01/06/24   Updated frequency / duration Episodic flares Frequency 7 times per 1 month Duration 2 day per episode  Anticipate duration 6-12+ months, may require renewal yearly or unless problem resolves   Will complete paperwork today and it can be faxed to [facility] 06/15/23   No orders of the defined types were placed in this encounter.   No orders of the defined types were placed in this encounter.   Follow up plan: Return in about 4 months (around 10/14/2023) for 4 to 4.5 months Annual Physical early AM visit fasting lab AFTER.   Saralyn Pilar, DO [facility] 06/14/2023, 9:41 AM

## 2023-06-14 NOTE — Patient Instructions (Addendum)
 Thank you for coming to the office today.  Renewal paperwork for FMLA today for the Right Shoulder, intermittent leave.  Updates  Start date 06/04/23  Additional dates out 06/04/23, 06/10/23 and 06/11/23,   End date 01/06/24  Intermittent only   Episodic flares Frequency 7 times per 1 month Duration 2 day per episode  Please schedule a Follow-up Appointment to: Return in about 4 months (around 10/14/2023) for 4 to 4.5 months Annual Physical early AM visit fasting lab AFTER.  If you have any other questions or concerns, please feel free to call the office or send a message through MyChart. You may also schedule an earlier appointment if necessary.  Additionally, you may be receiving a survey about your experience at our office within a few days to 1 week by e-mail or mail. We value your feedback.  Saralyn Pilar, DO [facility], [place]

## 2023-07-26 ENCOUNTER — Ambulatory Visit: Admitting: Family Medicine

## 2023-08-30 ENCOUNTER — Ambulatory Visit: Payer: Self-pay

## 2023-08-30 NOTE — Telephone Encounter (Signed)
 Copied from CRM [id number]. Topic: Clinical - Red Word Triage >> Aug 30, 2023  9:02 AM Hassie Lint wrote: Red Word that prompted transfer to Nurse Triage: Patient is experiencing pain in his right shoulder. Says has been ongoing for months but it has recently been getting worse. Also states wants to be seen for male issues but will not disclose any additional information.   Chief Complaint: Shoulder Pain  Symptoms: Numbness, Tingling  Frequency: Worsening, One month  Pertinent Negatives: Patient denies discoloration  Disposition: [] ED /[] Urgent Care (no appt availability in office) / [] Appointment(In office/virtual)/ []  [place] [facility]/ [] Home Care/ [] Refused Recommended Disposition /[] [place] [facility]/ []  Follow-up with PCP Additional Notes: BM is being triaged for worsening shoulder pain and another issue he would not verbalize at this time. The patient reports worsening shoulder pain over the past month that is not relieved by OTC analgesics. In office appointment Scheduled per protocol on Aug 30, 2023.      Reason for Disposition  Numbness (i.e., loss of sensation) in hand or fingers  Answer Assessment - Initial Assessment Questions 1. ONSET: "When did the pain start?"     A month  2. LOCATION: "Where is the pain located?"     Right Shoulder  3. PAIN: "How bad is the pain?" (Scale 1-10; or mild, moderate, severe)   - MILD (1-3): doesn't interfere with normal activities   - MODERATE (4-7): interferes with normal activities (e.g., work or school) or awakens from sleep   - SEVERE (8-10): excruciating pain, unable to do any normal activities, unable to move arm at all due to pain     8  4. WORK OR EXERCISE: "Has there been any recent work or exercise that involved this part of the body?"     Work Related Injury  5. CAUSE: "What do you think is causing the shoulder pain?"     Work related injury  6. OTHER SYMPTOMS: "Do you have any other symptoms?" (e.g., neck pain,  swelling, rash, fever, numbness, weakness)     Numbness, Tingling  Protocols used: Shoulder Pain-A-AH

## 2023-08-30 NOTE — Telephone Encounter (Signed)
 Will discuss at upcoming appointment

## 2023-08-31 ENCOUNTER — Ambulatory Visit: Admitting: Internal Medicine

## 2023-09-06 ENCOUNTER — Ambulatory Visit: Admitting: Family Medicine

## 2023-09-17 ENCOUNTER — Ambulatory Visit: Payer: Self-pay

## 2023-09-17 NOTE — Telephone Encounter (Signed)
 FYI Only or Action Required?: FYI only for provider  Patient was last seen in primary care on 06/14/2023 by Raina Bunting, DO. Called Nurse Triage reporting Rash. Symptoms began 2 weeks ago. Interventions attempted: OTC medications: topical and ibuprofen . Symptoms are: gradually worsening.  Triage Disposition: See Physician Within 24 Hours  Patient/caregiver understands and will follow disposition?: Yes               Copied from CRM [id number]. Topic: Clinical - Red Word Triage >> Sep 17, 2023  8:58 AM Samuel Davis wrote: Red Word that prompted transfer to Nurse Triage: Rash on left arm, left arm very itchy, in the bend of arm arm is hurting and sore, near bicep, and wrist. Getting worse over last 2 weeks. Reason for Disposition  [1] Localized rash is very painful AND [2] no fever  Answer Assessment - Initial Assessment Questions 1. APPEARANCE of RASH: "Describe the rash."      Fine raised bumps and red 2. LOCATION: "Where is the rash located?"      Left arm 3. NUMBER: "How many spots are there?"      3  4. SIZE: "How big are the spots?" (Inches, centimeters or compare to size of a coin)      Quarter- 5. ONSET: "When did the rash start?"      A couple days ago  6. ITCHING: "Does the rash itch?" If Yes, ask: "How bad is the itch?"  (Scale 0-10; or none, mild, moderate, severe)     7-8/10 7. PAIN: "Does the rash hurt?" If Yes, ask: "How bad is the pain?"  (Scale 0-10; or none, mild, moderate, severe)    - NONE (0): no pain    - MILD (1-3): doesn't interfere with normal activities     - MODERATE (4-7): interferes with normal activities or awakens from sleep     - SEVERE (8-10): excruciating pain, unable to do any normal activities     5/10  8. OTHER SYMPTOMS: "Do you have any other symptoms?" (e.g., fever)     no  Protocols used: Rash or Redness - Localized-A-AH

## 2023-09-18 ENCOUNTER — Encounter: Payer: Self-pay | Admitting: Family Medicine

## 2023-09-18 ENCOUNTER — Ambulatory Visit (INDEPENDENT_AMBULATORY_CARE_PROVIDER_SITE_OTHER): Admitting: Family Medicine

## 2023-09-18 VITALS — BP 130/84 | HR 73 | Ht 74.0 in | Wt 225.4 lb

## 2023-09-18 DIAGNOSIS — B0229 Other postherpetic nervous system involvement: Secondary | ICD-10-CM

## 2023-09-18 DIAGNOSIS — B029 Zoster without complications: Secondary | ICD-10-CM

## 2023-09-18 MED ORDER — GABAPENTIN 100 MG PO CAPS
ORAL_CAPSULE | ORAL | 1 refills | Status: AC
Start: 2023-09-18 — End: ?

## 2023-09-18 MED ORDER — PREDNISONE 20 MG PO TABS
ORAL_TABLET | ORAL | 0 refills | Status: DC
Start: 2023-09-18 — End: 2023-10-30

## 2023-09-18 MED ORDER — VALACYCLOVIR HCL 1 G PO TABS
1000.0000 mg | ORAL_TABLET | Freq: Three times a day (TID) | ORAL | 0 refills | Status: DC
Start: 2023-09-18 — End: 2023-10-30

## 2023-09-18 NOTE — Progress Notes (Signed)
 Subjective:    Patient ID: Samuel Davis. Samuel Davis, male    DOB: 06-26-1970, 53 y.o.   MRN: [id number]  Samuel Davis is a 53 y.o. male presenting on 09/18/2023 for Herpes Zoster  Patient presents for a same day appointment.  HPI  Discussed the use of AI scribe software for clinical note transcription with the patient, who gave verbal consent to proceed.  History of Present Illness   Samuel Davis is a 53 year old male who presents with a rash and pain on his left arm.  He developed a rash and pain on his left arm over the weekend. The rash consists of three distinct spots that are painful and itchy, with a sensation described as pulling or burning of muscle'. The rash appears in clusters and follows a pattern, which he suspects might be related to contact with a tree that hangs over his fence otherwise he was worried about Shingles however he has not had chickenpox in the past but he has been exposed.  He has been using topical treatments like calamine lotion, but the rash is in a blistering phase. He is aware that scratching could lead to infection.      No prior shingles vaccine.      09/18/2023   10:32 AM 06/14/2023    9:19 AM 06/08/2023   11:33 AM  Depression screen PHQ 2/9  Decreased Interest 0 0 0  Down, Depressed, Hopeless 0 0 0  PHQ - 2 Score 0 0 0  Altered sleeping 0 0   Tired, decreased energy 0 0   Change in appetite 0 0   Feeling bad or failure about yourself  0 0   Trouble concentrating 0 0   Moving slowly or fidgety/restless 0 0   Suicidal thoughts 0 0   PHQ-9 Score 0 0   Difficult doing work/chores  Not difficult at all        09/18/2023   10:32 AM 06/14/2023    9:19 AM 06/08/2023   11:33 AM 04/06/2023    3:55 PM  GAD 7 : Generalized Anxiety Score  Nervous, Anxious, on Edge 0 0 0 0  Control/stop worrying 0 0 0 0  Worry too much - different things 0 0 0 0  Trouble relaxing 0 0 0 0  Restless 0 0 0 0  Easily annoyed or irritable 0 0 0 0  Afraid - awful might  happen 0 0 0 0  Total GAD 7 Score 0 0 0 0  Anxiety Difficulty  Not difficult at all      Social History   Tobacco Use   Smoking status: Former    Current packs/day: 0.00    Average packs/day: 0.8 packs/day for 10.0 years (7.5 ttl pk-yrs)    Types: Cigarettes    Start date: 09/09/2007    Quit date: 09/08/2017    Years since quitting: 6.0   Smokeless tobacco: Former   Tobacco comments:    Quit cold [place]  Building services engineer status: Never Used  Substance Use Topics   Alcohol use: Not Currently    Alcohol/week: 7.0 standard drinks of alcohol    Types: 7 Cans of beer per week   Drug use: Never    Review of Systems Per HPI unless specifically indicated above     Objective:     BP 130/84 (BP Location: Right Arm, Patient Position: Sitting, Cuff Size: Large)   Pulse 73   Ht 6\' 2"  (1.88 m)  Wt 225 lb 6 oz (102.2 kg)   SpO2 94%   BMI 28.94 kg/m   Wt Readings from Last 3 Encounters:  09/18/23 225 lb 6 oz (102.2 kg)  06/14/23 218 lb (98.9 kg)  06/08/23 223 lb (101.2 kg)    Physical Exam Vitals and nursing note reviewed.  Constitutional:      General: He is not in acute distress.    Appearance: Normal appearance. He is well-developed. He is not diaphoretic.     Comments: Well-appearing, comfortable, cooperative  HENT:     Head: Normocephalic and atraumatic.  Eyes:     General:        Right eye: No discharge.        Left eye: No discharge.     Conjunctiva/sclera: Conjunctivae normal.  Cardiovascular:     Rate and Rhythm: Normal rate.  Pulmonary:     Effort: Pulmonary effort is normal.  Skin:    General: Skin is warm and dry.     Findings: Lesion and rash (vesicular clusters rash lesions on left inner wrist up into elbow and upper arm wrapping to upper left shoulder, in pattern of C5 dermatome, some blisters scabbing now.) present. No erythema.  Neurological:     Mental Status: He is alert and oriented to person, place, and time.  Psychiatric:        Mood and  Affect: Mood normal.        Behavior: Behavior normal.        Thought Content: Thought content normal.     Comments: Well groomed, good eye contact, normal speech and thoughts     Left wrist   Left arm    Results for orders placed or performed during the hospital encounter of 03/21/23  CBC   Collection Time: 03/21/23  2:52 AM  Result Value Ref Range   WBC 5.2 4.0 - 10.5 K/uL   RBC 5.69 4.22 - 5.81 MIL/uL   Hemoglobin 13.3 13.0 - 17.0 g/dL   HCT 16.1 09.6 - 04.5 %   MCV 73.5 (L) 80.0 - 100.0 fL   MCH 23.4 (L) 26.0 - 34.0 pg   MCHC 31.8 30.0 - 36.0 g/dL   RDW 40.9 81.1 - 91.4 %   Platelets 256 150 - 400 K/uL   nRBC 0.0 0.0 - 0.2 %  Basic metabolic panel   Collection Time: 03/21/23  2:52 AM  Result Value Ref Range   Sodium 137 135 - 145 mmol/L   Potassium 4.1 3.5 - 5.1 mmol/L   Chloride 107 98 - 111 mmol/L   CO2 21 (L) 22 - 32 mmol/L   Glucose, Bld 107 (H) 70 - 99 mg/dL   BUN 16 6 - 20 mg/dL   Creatinine, Ser 7.82 0.61 - 1.24 mg/dL   Calcium 8.9 8.9 - 95.6 mg/dL   GFR, Estimated >21 >30 mL/min   Anion gap 9 5 - 15  CK   Collection Time: 03/21/23  2:52 AM  Result Value Ref Range   Total CK 166 49 - 397 U/L      Assessment & Plan:   Problem List Items Addressed This Visit   None Visit Diagnoses       Herpes zoster without complication    -  Primary   Relevant Medications   valACYclovir (VALTREX) 1000 MG tablet   predniSONE  (DELTASONE ) 20 MG tablet   Other Relevant Orders   Varicella zoster antibody, IgG     Post herpetic neuralgia  Relevant Medications   gabapentin (NEURONTIN) 100 MG capsule        Shingles (Herpes Zoster) Acute shingles with blistering rash on left arm, C5 dermatomal pattern. Symptoms indicate nerve involvement. Discussed reactivation of varicella-zoster virus, potential for postherpetic neuralgia, and treatment options.  Discussed gabapentin for nerve pain, noting sedation risk. Emphasized avoiding scratching to prevent  infection.  - Prescribe valacyclovir 3 times daily for 7 days. - Prescribe prednisone  for 7 days for burst therapy - Consider gabapentin for persistent nerve pain post-treatment. Will go ahead and order he can titrate adjust dose - Advise topical agents like Vaseline or cortisone for rash. - Order blood test for shingles antibodies 6 weeks post-treatment if desired. - Advise against scratching.  Eligible for shingles vaccine, recommended for those over 50. Discussed vaccine reduces future outbreak severity but not immunity. Option for blood test for shingles antibodies before vaccination to confirm past infection. - Advise shingles vaccine 6 weeks after current episode resolution. - Discuss option for blood test for shingles antibodies before vaccination.  Follow-up Plan to ensure shingles resolution and confirm diagnosis through blood test if desired. - Schedule blood test for shingles antibodies on August 8th at 8:00 AM. - Advise follow-up if symptoms persist or worsen post-treatment.        Orders Placed This Encounter  Procedures   Varicella zoster antibody, IgG    Standing Status:   Future    Expected Date:   11/16/2023    Expiration Date:   09/17/2024    Meds ordered this encounter  Medications   valACYclovir (VALTREX) 1000 MG tablet    Sig: Take 1 tablet (1,000 mg total) by mouth 3 (three) times daily. For shingles    Dispense:  21 tablet    Refill:  0   predniSONE  (DELTASONE ) 20 MG tablet    Sig: Take daily with food. Start with 60mg  (3 pills) x 2 days, then reduce to 40mg  (2 pills) x 2 days, then 20mg  (1 pill) x 3 days    Dispense:  13 tablet    Refill:  0   gabapentin (NEURONTIN) 100 MG capsule    Sig: Start 1 capsule daily, increase by 1 cap every 2-3 days as tolerated up to 3 times a day, or may take 3 at once in evening.    Dispense:  90 capsule    Refill:  1    Follow up plan: Return in about 8 weeks (around 11/16/2023).  Domingo Friend, DO [facility] 09/18/2023, 10:47 AM

## 2023-09-18 NOTE — Patient Instructions (Addendum)
 Thank you for coming to the office today.  Start Prednisone  7 day taper, hold Naproxen .  May use vaseline, or neosporin or ointment for rash.  Start the anti-viral medication - Valtrex take one 3 times a day for 7 days to complete the entire course. This will help the current flare up heal, including the rash to dry up and resolve quicker. It may take several days to weeks for the rash to completely resolve. Typically it will dry up and scab off.  Blister and Rash Care Take a cool bath or apply cool compresses to the area of the rash or blisters as directed by your health care provider. This may help with pain and itching. Keep your rash covered with a loose bandage (dressing). Wear loose-fitting clothing to help ease the pain of material rubbing against the rash. Keep your rash and blisters clean with mild soap and cool water  or as directed by your health care provider. Check your rash every day for signs of infection. These include redness, swelling, and pain that lasts or increases. Do not pick your blisters. Do not scratch your rash.  As mentioned, some patients experience "Post-Herpetic Neuralgia" or a persistent sensation or feeling of "nerve irritation or pain" that can last for days to weeks to months or longer in this same area.  You may try the nerve medicine to help ease these symptoms now or in the future if it is needed. Try Gabapentin 100mg  capsules, take at night for 2-3 nights only, and then increase to 2 times a day for a few days, and then may increase to 3 times a day, it may make you drowsy, if helps significantly at night only, then you can increase instead to 3 capsules at night, instead of 3 times a day - In the future if needed, we can significantly increase the dose if tolerated well, some common doses are 300mg  three times a day up to 600mg  three times a day, usually it takes several weeks or months to get to higher doses  It is contagious to patients who have never had  Chicken Pox - or someone who is pregnant (unborn baby is at risk) - or elderly with weakened immune system. Try to avoid direct close contact with others on the skin if you have active blisters. Once the rash dries up and heals, it is less of a concern.  You may get the Shingles vaccine (Shingrix) approximately 6 weeks after the rash and episode is resolved. It is a two dose series of vaccines.   You may get this at the pharmacy   Contact your doctor if: Your pain is not relieved with prescribed medicines. Your pain does not get better after the rash heals. Your rash looks infected. Signs of infection include redness, swelling, and pain that lasts or increases.  Please seek more immediate medical attention at the [facility] Emergency Department IF The rash is on your face or nose. You have facial pain, pain around your eye area, or loss of feeling on one side of your face. You have ear pain or you have ringing in your ear. You have loss of taste. Your condition gets worse.    ------------------------------------------------------------------------------------------------------- Please review the additional information below about Shingles, if you have additional concerns.  Shingles Shingles, which is also known as herpes zoster, is an infection that causes a painful skin rash and fluid-filled blisters. Shingles is not related to genital herpes, which is a sexually transmitted infection. Shingles only develops in  people who: Have had chickenpox. Have received the chickenpox vaccine. (This is rare.)  What are the causes? Shingles is caused by varicella-zoster virus (VZV). This is the same virus that causes chickenpox. After exposure to VZV, the virus stays in the body in an inactive (dormant) state. Shingles develops if the virus reactivates. This can happen many years after the initial exposure to VZV. It is not known what causes this virus to reactivate. What increases the risk? People  who have had chickenpox or received the chickenpox vaccine are at risk for shingles. Infection is more common in people who: Are older than age 68. Have a weakened defense (immune) system, such as those with HIV, AIDS, or cancer. Are taking medicines that weaken the immune system, such as transplant medicines. Are under great stress.  What are the signs or symptoms? Early symptoms of this condition include itching, tingling, and pain in an area on your skin. Pain may be described as burning, stabbing, or throbbing. A few days or weeks after symptoms start, a painful red rash appears, usually on one side of the body in a bandlike or beltlike pattern. The rash eventually turns into fluid-filled blisters that break open, scab over, and dry up in about 2-3 weeks. At any time during the infection, you may also develop: A fever. Chills. A headache. An upset stomach.  How is this diagnosed? This condition is diagnosed with a skin exam. Sometimes, skin or fluid samples are taken from the blisters before a diagnosis is made. These samples are examined under a microscope or sent to a lab for testing. How is this treated? There is no specific cure for this condition. Your health care provider will probably prescribe medicines to help you manage pain, recover more quickly, and avoid long-term problems. Medicines may include: Antiviral drugs. Anti-inflammatory drugs. Pain medicines.  If the area involved is on your face, you may be referred to a specialist, such as an eye doctor (ophthalmologist) or an ear, nose, and throat (ENT) doctor to help you avoid eye problems, chronic pain, or disability. Follow these instructions at home: Medicines Take medicines only as directed by your health care provider. Apply an anti-itch or numbing cream to the affected area as directed by your health care provider. General instructions Rest as directed by your health care provider. Keep all follow-up visits as  directed by your health care provider. This is important. Until your blisters scab over, your infection can cause chickenpox in people who have never had it or been vaccinated against it. To prevent this from happening, avoid contact with other people, especially: Babies. Pregnant women. Children who have eczema. Elderly people who have transplants. People who have chronic illnesses, such as leukemia or AIDS. This information is not intended to replace advice given to you by your health care provider. Make sure you discuss any questions you have with your health care provider. Document Released: 03/27/2005 Document Revised: 11/21/2015 Document Reviewed: 02/05/2014 Elsevier Interactive Patient Education  2018 [employer].    Please schedule a Follow-up Appointment to: Return in about 8 weeks (around 11/16/2023).  If you have any other questions or concerns, please feel free to call the office or send a message through MyChart. You may also schedule an earlier appointment if necessary.  Additionally, you may be receiving a survey about your experience at our office within a few days to 1 week by e-mail or mail. We value your feedback.  Domingo Friend, DO [facility], [place]

## 2023-10-26 ENCOUNTER — Encounter: Payer: Self-pay | Admitting: Internal Medicine

## 2023-10-26 ENCOUNTER — Ambulatory Visit (INDEPENDENT_AMBULATORY_CARE_PROVIDER_SITE_OTHER): Payer: PRIVATE HEALTH INSURANCE | Admitting: Internal Medicine

## 2023-10-26 DIAGNOSIS — M51362 Other intervertebral disc degeneration, lumbar region with discogenic back pain and lower extremity pain: Secondary | ICD-10-CM

## 2023-10-26 MED ORDER — BACLOFEN 10 MG PO TABS: 5.0000 mg | ORAL_TABLET | Freq: Three times a day (TID) | ORAL | 0 refills | Status: AC | PRN

## 2023-10-26 MED ORDER — NAPROXEN 500 MG PO TABS: 500.0000 mg | ORAL_TABLET | Freq: Two times a day (BID) | ORAL | 0 refills | Status: AC

## 2023-10-26 NOTE — Progress Notes (Signed)
 Subjective:    Patient ID: Samuel Davis. Robinson, male    DOB: 08/05/1970, 53 y.o.   MRN: [id number]  HPI  Patient presents to clinic today with complaint of low back pain.  He reports this has been an intermittent issue for 2 to 3 years but has seemed to flareup in the last 3 to 4 days.  He describes the pain as sore and achy, located in the central portion of the low back.  He reports he occasionally has muscle spasms in his hamstrings but denies any sharp, shooting or radiating pain into his lower extremities.  He denies numbness, tingling or weakness of his lower extremities.  He denies loss of bowel or bladder control.  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  He has had no prior back surgery.  He has tried Tylenol , ibuprofen  and stretching OTC with minimal relief of symptoms.  He was seen for his PCP 06/08/2023 for the same.  At that time he was prescribed naproxen  and baclofen  which he reports does help him during flares.  He has never undergone physical therapy.  Dr. Edman ordered a x-ray of the lumbar spine however he has not yet completed this.   Review of Systems   Past Medical History:  Diagnosis Date   Wears dentures    partial upper    Current Outpatient Medications  Medication Sig Dispense Refill   baclofen  (LIORESAL ) 10 MG tablet Take 0.5-1 tablets (5-10 mg total) by mouth 3 (three) times daily as needed for muscle spasms. 60 each 2   CALCIUM PO Take by mouth.     COLLAGEN PO Take by mouth.     gabapentin  (NEURONTIN ) 100 MG capsule Start 1 capsule daily, increase by 1 cap every 2-3 days as tolerated up to 3 times a day, or may take 3 at once in evening. 90 capsule 1   MAGNESIUM PO Take by mouth.     naproxen  (NAPROSYN ) 500 MG tablet Take 1 tablet (500 mg total) by mouth 2 (two) times daily with a meal. For 2-4 weeks then as needed 60 tablet 2   predniSONE  (DELTASONE ) 20 MG  tablet Take daily with food. Start with 60mg  (3 pills) x 2 days, then reduce to 40mg  (2 pills) x 2 days, then 20mg  (1 pill) x 3 days 13 tablet 0   sildenafil  (REVATIO ) 20 MG tablet Take 1-5 pills about 30 min prior to sex. Start with 1 and increase as needed. 90 tablet 3   valACYclovir  (VALTREX ) 1000 MG tablet Take 1 tablet (1,000 mg total) by mouth 3 (three) times daily. For shingles 21 tablet 0   VITAMIN D PO Take by mouth.     No current facility-administered medications for this visit.    No Known Allergies  Family History  Problem Relation Age of Onset   Healthy Mother    Colon cancer Father 7       possibly earlier, but not diagnosed   Hypertension Sister     Social History   Socioeconomic History   Marital status: Single    Spouse name: Not on file   Number of children: Not on file   Years of education: [place], [employer]   Highest education level: Associate degree: occupational, Scientist, product/process development, or vocational program  Occupational History   Occupation: Distribution  Tobacco Use   Smoking status: Former    Current packs/day: 0.00  Average packs/day: 0.8 packs/day for 10.0 years (7.5 ttl pk-yrs)    Types: Cigarettes    Start date: 09/09/2007    Quit date: 09/08/2017    Years since quitting: 6.1   Smokeless tobacco: Former   Tobacco comments:    Quit cold [place]  Building services engineer status: Never Used  Substance and Sexual Activity   Alcohol use: Not Currently    Alcohol/week: 7.0 standard drinks of alcohol    Types: 7 Cans of beer per week   Drug use: Never   Sexual activity: Not on file  Other Topics Concern   Not on file  Social History Narrative   ** Merged History Encounter **       Social Drivers of Health   Financial Resource Strain: Patient Declined (03/26/2023)   Overall Financial Resource Strain (CARDIA)    Difficulty of Paying Living Expenses: Patient declined  Food Insecurity: Patient Declined (03/26/2023)   Hunger Vital Sign    Worried About Running  Out of Food in the Last Year: Patient declined    Ran Out of Food in the Last Year: Patient declined  Transportation Needs: Patient Declined (03/26/2023)   PRAPARE - Administrator, Civil Service (Medical): Patient declined    Lack of Transportation (Non-Medical): Patient declined  Physical Activity: Not on file  Stress: Not on file  Social Connections: Unknown (03/26/2023)   Social Connection and Isolation Panel    Frequency of Communication with Friends and Family: More than three times a week    Frequency of Social Gatherings with Friends and Family: Once a week    Attends Religious Services: Never    Database administrator or Organizations: No    Attends Engineer, structural: Not on file    Marital Status: Patient declined  Intimate Partner Violence: Not on file     Constitutional: Denies fever, malaise, fatigue, headache or abrupt weight changes.  Respiratory: Denies difficulty breathing, shortness of breath, cough or sputum production.   Cardiovascular: Denies chest pain, chest tightness, palpitations or swelling in the hands or feet.  Gastrointestinal: Denies abdominal pain, bloating, constipation, diarrhea or blood in the stool.  GU: Denies urgency, frequency, pain with urination, burning sensation, blood in urine, odor or discharge. Musculoskeletal: Pt reports low back pain. Denies decrease in range of motion, difficulty with gait, muscle pain or joint swelling.  Neurological: Denies numbness, tingling, weakness or problems with balance and coordination.    No other specific complaints in a complete review of systems (except as listed in HPI above).      Objective:   Physical Exam  BP 138/84 (BP Location: Left Arm, Patient Position: Sitting, Cuff Size: Normal)   Ht 6' 2 (1.88 m)   Wt 224 lb (101.6 kg)   BMI 28.76 kg/m   Wt Readings from Last 3 Encounters:  09/18/23 225 lb 6 oz (102.2 kg)  06/14/23 218 lb (98.9 kg)  06/08/23 223 lb (101.2 kg)     General: Appears his stated age, overweight, in NAD. Skin: Warm, dry and intact.  Cardiovascular: Normal rate and rhythm.  Pulmonary/Chest: Normal effort and positive vesicular breath sounds. No respiratory distress. No wheezes, rales or ronchi noted.  Musculoskeletal: Normal flexion, rotation and lateral bending of the spine.  Decreased extension of the spine secondary to pain.  Pain with palpation over the lumbar spine.  No pain with palpation of the para spinal muscles.  Strength 5/5 BLE.  No difficulty with gait.  Neurological: Alert and oriented.    BMET    Component Value Date/Time   NA 137 03/21/2023 0252   K 4.1 03/21/2023 0252   CL 107 03/21/2023 0252   CO2 21 (L) 03/21/2023 0252   GLUCOSE 107 (H) 03/21/2023 0252   BUN 16 03/21/2023 0252   CREATININE 0.80 03/21/2023 0252   CREATININE 0.93 11/08/2022 0754   CALCIUM 8.9 03/21/2023 0252   GFRNONAA >60 03/21/2023 0252   GFRNONAA 103 12/19/2019 0958   GFRAA 119 12/19/2019 0958    Lipid Panel     Component Value Date/Time   CHOL 193 11/08/2022 0754   TRIG 37 11/08/2022 0754   HDL 86 11/08/2022 0754   CHOLHDL 2.2 11/08/2022 0754   LDLCALC 96 11/08/2022 0754    CBC    Component Value Date/Time   WBC 5.2 03/21/2023 0252   RBC 5.69 03/21/2023 0252   HGB 13.3 03/21/2023 0252   HCT 41.8 03/21/2023 0252   PLT 256 03/21/2023 0252   MCV 73.5 (L) 03/21/2023 0252   MCH 23.4 (L) 03/21/2023 0252   MCHC 31.8 03/21/2023 0252   RDW 14.4 03/21/2023 0252   LYMPHSABS 1,890 11/08/2022 0754   EOSABS 181 11/08/2022 0754   BASOSABS 50 11/08/2022 0754    Hgb A1C Lab Results  Component Value Date   HGBA1C 6.0 (H) 11/08/2022            Assessment & Plan:   Acute on chronic low back pain:  No radicular symptoms noted on exam Will refill naproxen  500 mg twice daily as needed, avoid overuse and consume with food Will refill baclofen  10 mg every 8 hours as needed-sedation caution given Encouraged heat and  stretching Discussed referral to physical therapy however he would like to hold off at this time Encouraged him to come back next week for the x-ray of the lumbar spine that was previously ordered by his PCP 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

## 2023-10-26 NOTE — Patient Instructions (Signed)

## 2023-10-30 ENCOUNTER — Ambulatory Visit (INDEPENDENT_AMBULATORY_CARE_PROVIDER_SITE_OTHER): Admitting: Family Medicine

## 2023-10-30 ENCOUNTER — Encounter: Payer: Self-pay | Admitting: Family Medicine

## 2023-10-30 VITALS — BP 132/80 | HR 94 | Ht 74.0 in | Wt 221.4 lb

## 2023-10-30 DIAGNOSIS — B0229 Other postherpetic nervous system involvement: Secondary | ICD-10-CM | POA: Diagnosis not present

## 2023-10-30 DIAGNOSIS — Z8619 Personal history of other infectious and parasitic diseases: Secondary | ICD-10-CM | POA: Diagnosis not present

## 2023-10-30 DIAGNOSIS — R030 Elevated blood-pressure reading, without diagnosis of hypertension: Secondary | ICD-10-CM

## 2023-10-30 DIAGNOSIS — R7309 Other abnormal glucose: Secondary | ICD-10-CM

## 2023-10-30 DIAGNOSIS — Z0000 Encounter for general adult medical examination without abnormal findings: Secondary | ICD-10-CM | POA: Diagnosis not present

## 2023-10-30 DIAGNOSIS — Z3009 Encounter for other general counseling and advice on contraception: Secondary | ICD-10-CM

## 2023-10-30 DIAGNOSIS — M51362 Other intervertebral disc degeneration, lumbar region with discogenic back pain and lower extremity pain: Secondary | ICD-10-CM | POA: Diagnosis not present

## 2023-10-30 DIAGNOSIS — E7800 Pure hypercholesterolemia, unspecified: Secondary | ICD-10-CM

## 2023-10-30 DIAGNOSIS — G8929 Other chronic pain: Secondary | ICD-10-CM

## 2023-10-30 DIAGNOSIS — M25511 Pain in right shoulder: Secondary | ICD-10-CM

## 2023-10-30 DIAGNOSIS — Z125 Encounter for screening for malignant neoplasm of prostate: Secondary | ICD-10-CM

## 2023-10-30 NOTE — Progress Notes (Signed)
 Subjective:    Patient ID: Samuel Davis, male    DOB: [birth year], 53 y.o.   MRN: [id number]  Samuel Davis is a 53 y.o. male presenting on 10/30/2023 for Annual Exam   HPI  Discussed the use of AI scribe software for clinical note transcription with the patient, who gave verbal consent to proceed.  History of Present Illness   Samuel Davis is a 53 year old male who presents for an annual physical exam.  Hyperlipidemia and hyperglycemia - Elevated cholesterol and blood glucose levels identified in laboratory results from previous year - Undergoing repeat blood tests today for monitoring - Ate recently prior to blood draw today  Varicella-zoster immunity assessment History of shingles - Uncertain history of prior varicella (chicken pox) infection - Undergoing blood test to assess varicella-zoster virus antibody status to determine need for shingles vaccination  Colorectal cancer screening - Family history of colon cancer in father  Vasectomy / Contraceptive counseling - Inquired about vasectomy as a permanent contraceptive option - Referral to urology required for vasectomy evaluation      Health Maintenance:  Colonoscopy 06/13/19 Dr Unk - no polyps, he has fam history colon cancer with father, repeat 5 years, 2026.     10/30/2023    8:17 AM 10/26/2023   11:19 AM 09/18/2023   10:32 AM  Depression screen PHQ 2/9  Decreased Interest 0 0 0  Down, Depressed, Hopeless 0 0 0  PHQ - 2 Score 0 0 0  Altered sleeping 0 0 0  Tired, decreased energy 0 0 0  Change in appetite 0 0 0  Feeling bad or failure about yourself  0 0 0  Trouble concentrating 0 0 0  Moving slowly or fidgety/restless 0 0 0  Suicidal thoughts 0 0 0  PHQ-9 Score 0 0 0  Difficult doing work/chores  Not difficult at all        10/30/2023    8:17 AM 10/26/2023   11:20 AM 09/18/2023   10:32 AM 06/14/2023    9:19 AM  GAD 7 : Generalized Anxiety Score  Nervous, Anxious, on Edge 0 0 0 0  Control/stop  worrying 0 0 0 0  Worry too much - different things 0 0 0 0  Trouble relaxing 0 0 0 0  Restless 0 0 0 0  Easily annoyed or irritable 0 0 0 0  Afraid - awful might happen 0 0 0 0  Total GAD 7 Score 0 0 0 0  Anxiety Difficulty  Not difficult at all  Not difficult at all     Past Medical History:  Diagnosis Date   Wears dentures    partial upper   Past Surgical History:  Procedure Laterality Date   COLONOSCOPY WITH PROPOFOL  N/A 06/13/2019   Procedure: COLONOSCOPY WITH PROPOFOL ;  Surgeon: Unk Corinn Skiff, MD;  Location: [facility];  Service: Endoscopy;  Laterality: N/A;  Priority 4   THUMB ARTHROSCOPY  05/08/2018   Fracture repair.  [facility]   Social History   Socioeconomic History   Marital status: Single    Spouse name: Not on file   Number of children: Not on file   Years of education: College, [employer]   Highest education level: Associate degree: occupational, Scientist, product/process development, or vocational program  Occupational History   Occupation: Distribution  Tobacco Use   Smoking status: Former    Current packs/day: 0.00    Average packs/day: 0.8 packs/day for 10.0 years (7.5 ttl pk-yrs)  Types: Cigarettes    Start date: 09/09/2007    Quit date: 09/08/2017    Years since quitting: 6.1   Smokeless tobacco: Former   Tobacco comments:    Quit cold [place]  Building services engineer status: Never Used  Substance and Sexual Activity   Alcohol use: Not Currently    Alcohol/week: 7.0 standard drinks of alcohol    Types: 7 Cans of beer per week   Drug use: Never   Sexual activity: Not on file  Other Topics Concern   Not on file  Social History Narrative   ** Merged History Encounter **       Social Drivers of Health   Financial Resource Strain: Patient Declined (03/26/2023)   Overall Financial Resource Strain (CARDIA)    Difficulty of Paying Living Expenses: Patient declined  Food Insecurity: Patient Declined (03/26/2023)   Hunger Vital Sign    Worried About Running Out of  Food in the Last Year: Patient declined    Ran Out of Food in the Last Year: Patient declined  Transportation Needs: Patient Declined (03/26/2023)   PRAPARE - Administrator, Civil Service (Medical): Patient declined    Lack of Transportation (Non-Medical): Patient declined  Physical Activity: Not on file  Stress: Not on file  Social Connections: Unknown (03/26/2023)   Social Connection and Isolation Panel    Frequency of Communication with Friends and Family: More than three times a week    Frequency of Social Gatherings with Friends and Family: Once a week    Attends Religious Services: Never    Database administrator or Organizations: No    Attends Engineer, structural: Not on file    Marital Status: Patient declined  Catering manager Violence: Not on file   Family History  Problem Relation Age of Onset   Healthy Mother    Colon cancer Father 22       possibly earlier, but not diagnosed   Hypertension Sister    Current Outpatient Medications on File Prior to Visit  Medication Sig   baclofen  (LIORESAL ) 10 MG tablet Take 0.5-1 tablets (5-10 mg total) by mouth 3 (three) times daily as needed for muscle spasms.   CALCIUM PO Take by mouth.   COLLAGEN PO Take by mouth.   gabapentin  (NEURONTIN ) 100 MG capsule Start 1 capsule daily, increase by 1 cap every 2-3 days as tolerated up to 3 times a day, or may take 3 at once in evening.   MAGNESIUM PO Take by mouth.   naproxen  (NAPROSYN ) 500 MG tablet Take 1 tablet (500 mg total) by mouth 2 (two) times daily with a meal. For 2-4 weeks then as needed   sildenafil  (REVATIO ) 20 MG tablet Take 1-5 pills about 30 min prior to sex. Start with 1 and increase as needed.   VITAMIN D PO Take by mouth.   No current facility-administered medications on file prior to visit.    Review of Systems  Constitutional:  Negative for activity change, appetite change, chills, diaphoresis, fatigue and fever.  HENT:  Negative for congestion  and hearing loss.   Eyes:  Negative for visual disturbance.  Respiratory:  Negative for cough, chest tightness, shortness of breath and wheezing.   Cardiovascular:  Negative for chest pain, palpitations and leg swelling.  Gastrointestinal:  Negative for abdominal pain, constipation, diarrhea, nausea and vomiting.  Genitourinary:  Negative for dysuria, frequency and hematuria.  Musculoskeletal:  Negative for arthralgias and neck pain.  Skin:  Negative for rash.  Neurological:  Negative for dizziness, weakness, light-headedness, numbness and headaches.  Hematological:  Negative for adenopathy.  Psychiatric/Behavioral:  Negative for behavioral problems, dysphoric mood and sleep disturbance.    Per HPI unless specifically indicated above     Objective:    BP 132/80 (BP Location: Right Arm, Patient Position: Sitting, Cuff Size: Normal)   Pulse 94   Ht 6' 2 (1.88 m)   Wt 221 lb 6 oz (100.4 kg)   SpO2 94%   BMI 28.42 kg/m   Wt Readings from Last 3 Encounters:  10/30/23 221 lb 6 oz (100.4 kg)  10/26/23 224 lb (101.6 kg)  09/18/23 225 lb 6 oz (102.2 kg)    Physical Exam Vitals and nursing note reviewed.  Constitutional:      General: He is not in acute distress.    Appearance: He is well-developed. He is not diaphoretic.     Comments: Well-appearing, comfortable, cooperative  HENT:     Head: Normocephalic and atraumatic.  Eyes:     General:        Right eye: No discharge.        Left eye: No discharge.     Conjunctiva/sclera: Conjunctivae normal.     Pupils: Pupils are equal, round, and reactive to light.  Neck:     Thyroid: No thyromegaly.     Vascular: No carotid bruit.  Cardiovascular:     Rate and Rhythm: Normal rate and regular rhythm.     Pulses: Normal pulses.     Heart sounds: Normal heart sounds. No murmur heard. Pulmonary:     Effort: Pulmonary effort is normal. No respiratory distress.     Breath sounds: Normal breath sounds. No wheezing or rales.  Abdominal:      General: Bowel sounds are normal. There is no distension.     Palpations: Abdomen is soft. There is no mass.     Tenderness: There is no abdominal tenderness.  Musculoskeletal:        General: No tenderness. Normal range of motion.     Cervical back: Normal range of motion and neck supple.     Right lower leg: No edema.     Left lower leg: No edema.     Comments: Upper / Lower Extremities: - Normal muscle tone, strength bilateral upper extremities 5/5, lower extremities 5/5  Lymphadenopathy:     Cervical: No cervical adenopathy.  Skin:    General: Skin is warm and dry.     Findings: No erythema or rash.  Neurological:     Mental Status: He is alert and oriented to person, place, and time.     Comments: Distal sensation intact to light touch all extremities  Psychiatric:        Mood and Affect: Mood normal.        Behavior: Behavior normal.        Thought Content: Thought content normal.     Comments: Well groomed, good eye contact, normal speech and thoughts     Results for orders placed or performed during the hospital encounter of 03/21/23  CBC   Collection Time: 03/21/23  2:52 AM  Result Value Ref Range   WBC 5.2 4.0 - 10.5 K/uL   RBC 5.69 4.22 - 5.81 MIL/uL   Hemoglobin 13.3 13.0 - 17.0 g/dL   HCT 58.1 60.9 - 47.9 %   MCV 73.5 (L) 80.0 - 100.0 fL   MCH 23.4 (L) 26.0 - 34.0 pg   MCHC 31.8  30.0 - 36.0 g/dL   RDW 85.5 88.4 - 84.4 %   Platelets 256 150 - 400 K/uL   nRBC 0.0 0.0 - 0.2 %  Basic metabolic panel   Collection Time: 03/21/23  2:52 AM  Result Value Ref Range   Sodium 137 135 - 145 mmol/L   Potassium 4.1 3.5 - 5.1 mmol/L   Chloride 107 98 - 111 mmol/L   CO2 21 (L) 22 - 32 mmol/L   Glucose, Bld 107 (H) 70 - 99 mg/dL   BUN 16 6 - 20 mg/dL   Creatinine, Ser 9.19 0.61 - 1.24 mg/dL   Calcium 8.9 8.9 - 89.6 mg/dL   GFR, Estimated >39 >39 mL/min   Anion gap 9 5 - 15  CK   Collection Time: 03/21/23  2:52 AM  Result Value Ref Range   Total CK 166 49 - 397  U/L      Assessment & Plan:   Problem List Items Addressed This Visit     Elevated BP without diagnosis of hypertension   Elevated LDL cholesterol level   Relevant Orders   Lipid panel   Comprehensive metabolic panel with GFR   TSH   CT CARDIAC SCORING (SELF PAY ONLY)   Other Visit Diagnoses       Annual physical exam    -  Primary   Relevant Orders   Lipid panel   Hemoglobin A1c   CBC with Differential/Platelet   PSA   Comprehensive metabolic panel with GFR   Varicella zoster antibody, IgG     Degeneration of intervertebral disc of lumbar region with discogenic back pain and lower extremity pain         Post herpetic neuralgia         History of chicken pox       Relevant Orders   Varicella zoster antibody, IgG     Chronic right shoulder pain         Abnormal glucose       Relevant Orders   Hemoglobin A1c     Vasectomy evaluation       Relevant Orders   Ambulatory referral to Urology     Screening for prostate cancer       Relevant Orders   PSA        Updated Health Maintenance information Reviewed recent lab results with patient Encouraged improvement to lifestyle with diet and exercise Goal of weight loss  Annual Physical Examination Routine exam with stable health. Blood pressure 132/80 mmHg. Weight decreased to 221 lbs. Blood tests discussed despite non-fasting status. - Perform blood tests today, noting non-fasting status. - Cancel August lab appointment. Add PSA lab  Vasectomy Evaluation Discussed vasectomy and referral to urology. Explained procedure and recovery details. - Refer to [facility] for vasectomy evaluation.  Shingles Immunity Evaluation Uncertain varicella history. Plan to check varicella antibodies. Vaccine recommended if antibodies absent. - Order blood test for varicella antibodies. - Recommend shingles vaccine if antibodies absent.  Colorectal Cancer Screening Family history of colon cancer. Previous colonoscopy in 2021  clear. Repeat in 2026 due to family history. - Schedule repeat colonoscopy in 2026. Consider Cologuard  Cardiac Health Screening Discussed heart scan for coronary artery calcification. Patient interested in early detection. - Schedule heart scan for calcium score assessment.         Orders Placed This Encounter  Procedures   CT CARDIAC SCORING (SELF PAY ONLY)    Standing Status:   Future    Expiration Date:  10/29/2024    Preferred imaging location?:   [facility]   Lipid panel    Has the patient fasted?:   No   Hemoglobin A1c   CBC with Differential/Platelet   PSA   Comprehensive metabolic panel with GFR    Has the patient fasted?:   No   Varicella zoster antibody, IgG   TSH   Ambulatory referral to Urology    Referral Priority:   Routine    Referral Type:   Consultation    Referral Reason:   Specialty Services Required    Requested Specialty:   Urology    Number of Visits Requested:   1    No orders of the defined types were placed in this encounter.    Follow up plan: Return in about 1 year (around 10/29/2024).  Marsa Officer, DO [facility] 10/30/2023, 8:47 AM

## 2023-10-30 NOTE — Patient Instructions (Addendum)
 Thank you for coming to the office today.  If Varicella antibody present we can recommend Shingles vaccine. Check with pharmacy to get Shingles vaccine if we give you approval.  Referral to Urology for Vasectomy  [facility] -1st floor [address] [place],  [place]  [address] Phone: [phone]  Labs today  You have been referred for a Coronary Calcium Score Cardiac CT Scan. This is a screening test for patients aged 3-50+ with cardiovascular risk factors or who are healthy but would be interested in Cardiovascular Screening for heart disease. Even if there is a family history of heart disease, this imaging can be useful. Typically it can be done every 5+ years or at a different timeline we agree on  The scan will look at the chest and mainly focus on the heart and identify early signs of calcium build up or blockages within the heart arteries. It is not 100% accurate for identifying blockages or heart disease, but it is useful to help us  predict who may have some early changes or be at risk in the future for a heart attack or cardiovascular problem.  The results are reviewed by a Cardiologist and they will document the results. It should become available on MyChart. Typically the results are divided into percentiles based on other patients of the same demographic and age. So it will compare your risk to others similar to you. If you have a higher score >99 or higher percentile >75%tile, it is recommended to consider Statin cholesterol therapy and or referral to Cardiologist. I will try to help explain your results and if we have questions we can contact the Cardiologist.  You will be contacted for scheduling. Usually it is done at any imaging facility through [facility], [facility] or [facility].  The cost is $99 flat fee total and it does not go through insurance, so no authorization is  required.   Please schedule a Follow-up Appointment to: Return in about 1 year (around 10/29/2024).  If you have any other questions or concerns, please feel free to call the office or send a message through MyChart. You may also schedule an earlier appointment if necessary.  Additionally, you may be receiving a survey about your experience at our office within a few days to 1 week by e-mail or mail. We value your feedback.  Marsa Officer, DO [facility], [place]

## 2023-11-01 ENCOUNTER — Ambulatory Visit: Payer: Self-pay | Admitting: Family Medicine

## 2023-11-01 LAB — HEMOGLOBIN A1C
Hgb A1c MFr Bld: 6.1 % — ABNORMAL HIGH (ref <5.7)
Mean Plasma Glucose: 128 mg/dL
eAG (mmol/L): 7.1 mmol/L

## 2023-11-01 LAB — CBC WITH DIFFERENTIAL/PLATELET
Absolute Lymphocytes: 1595 {cells}/uL (ref 850–3900)
Absolute Monocytes: 501 {cells}/uL (ref 200–950)
Basophils Absolute: 28 {cells}/uL (ref 0–200)
Basophils Relative: 0.5 %
Eosinophils Absolute: 61 {cells}/uL (ref 15–500)
Eosinophils Relative: 1.1 %
HCT: 44.6 % (ref 38.5–50.0)
Hemoglobin: 13.6 g/dL (ref 13.2–17.1)
MCH: 23.4 pg — ABNORMAL LOW (ref 27.0–33.0)
MCHC: 30.5 g/dL — ABNORMAL LOW (ref 32.0–36.0)
MCV: 76.6 fL — ABNORMAL LOW (ref 80.0–100.0)
MPV: 10.3 fL (ref 7.5–12.5)
Monocytes Relative: 9.1 %
Neutro Abs: 3317 {cells}/uL (ref 1500–7800)
Neutrophils Relative %: 60.3 %
Platelets: 153 Thousand/uL (ref 140–400)
RBC: 5.82 Million/uL — ABNORMAL HIGH (ref 4.20–5.80)
RDW: 15.4 % — ABNORMAL HIGH (ref 11.0–15.0)
Total Lymphocyte: 29 %
WBC: 5.5 Thousand/uL (ref 3.8–10.8)

## 2023-11-01 LAB — VARICELLA ZOSTER ANTIBODY, IGG: Varicella IgG: 35.4 {s_co_ratio}

## 2023-11-01 LAB — COMPREHENSIVE METABOLIC PANEL WITH GFR
AG Ratio: 1.9 (calc) (ref 1.0–2.5)
ALT: 55 U/L — ABNORMAL HIGH (ref 9–46)
AST: 39 U/L — ABNORMAL HIGH (ref 10–35)
Albumin: 4.6 g/dL (ref 3.6–5.1)
Alkaline phosphatase (APISO): 37 U/L (ref 35–144)
BUN: 23 mg/dL (ref 7–25)
CO2: 27 mmol/L (ref 20–32)
Calcium: 9.7 mg/dL (ref 8.6–10.3)
Chloride: 106 mmol/L (ref 98–110)
Creat: 0.94 mg/dL (ref 0.70–1.30)
Globulin: 2.4 g/dL (ref 1.9–3.7)
Glucose, Bld: 113 mg/dL — ABNORMAL HIGH (ref 65–99)
Potassium: 4.2 mmol/L (ref 3.5–5.3)
Sodium: 141 mmol/L (ref 135–146)
Total Bilirubin: 0.4 mg/dL (ref 0.2–1.2)
Total Protein: 7 g/dL (ref 6.1–8.1)
eGFR: 98 mL/min/1.73m2 (ref >=60)

## 2023-11-01 LAB — LIPID PANEL
Cholesterol: 270 mg/dL — ABNORMAL HIGH (ref <200)
HDL: 83 mg/dL (ref >=40)
LDL Cholesterol (Calc): 162 mg/dL — ABNORMAL HIGH
Non-HDL Cholesterol (Calc): 187 mg/dL — ABNORMAL HIGH (ref <130)
Total CHOL/HDL Ratio: 3.3 (calc) (ref <5.0)
Triglycerides: 128 mg/dL (ref <150)

## 2023-11-01 LAB — PSA: PSA: 0.63 ng/mL (ref <=4.00)

## 2023-11-01 LAB — TSH: TSH: 1.03 m[IU]/L (ref 0.40–4.50)

## 2023-11-12 ENCOUNTER — Telehealth: Payer: Self-pay

## 2023-11-12 NOTE — Telephone Encounter (Signed)
 Copied from CRM #[id number]. Topic: Appointments - Appointment Info/Confirmation >> Nov 12, 2023 12:30 PM Montie POUR wrote: Patient/patient representative is calling for information regarding an appointment.  Samuel Davis wants to schedule an appointment for his first Shingles (Zoster). Please call him at [phone] to schedule. Thanks

## 2023-11-13 ENCOUNTER — Ambulatory Visit (INDEPENDENT_AMBULATORY_CARE_PROVIDER_SITE_OTHER)

## 2023-11-13 DIAGNOSIS — Z23 Encounter for immunization: Secondary | ICD-10-CM

## 2023-11-16 ENCOUNTER — Other Ambulatory Visit

## 2023-11-29 ENCOUNTER — Encounter: Payer: Self-pay | Admitting: Family Medicine

## 2023-11-29 NOTE — Progress Notes (Signed)
 Patient requested adjusted dates / info on last FMLA form from March 2025.  Updated to change from 2 days per episode to 3 days per episode and added dates missed 8/17, 8/18, 8/19  Will be faxed back.  Future changes or updates and renewal will be due with new office visit and form completion  Marsa Officer, DO [facility] 11/29/2023, 1:35 PM

## 2023-12-05 ENCOUNTER — Ambulatory Visit: Admitting: Family Medicine

## 2023-12-13 ENCOUNTER — Telehealth: Payer: Self-pay | Admitting: Family Medicine

## 2023-12-13 ENCOUNTER — Ambulatory Visit
Admission: RE | Admit: 2023-12-13 | Discharge: 2023-12-13 | Disposition: A | Discharge status: Home / Self Care | Source: Ambulatory Visit | Attending: Family Medicine | Admitting: Family Medicine

## 2023-12-13 DIAGNOSIS — M545 Low back pain, unspecified: Secondary | ICD-10-CM | POA: Insufficient documentation

## 2023-12-13 DIAGNOSIS — G8929 Other chronic pain: Secondary | ICD-10-CM | POA: Diagnosis present

## 2023-12-13 NOTE — Telephone Encounter (Signed)
 I checked his future orders, he has an X-ray for his Lumbar Spine already in the system from last year. I can re order it if he wants. I would not be able to really discuss much with it other than share his results unless we follow-up on it. But I can re order it and he can get the x-ray  Marsa Officer, DO [facility] 12/13/2023, 11:06 AM

## 2023-12-13 NOTE — Addendum Note (Signed)
 Addended by: EDMAN MARSA PARAS on: 12/13/2023 11:07 AM   Modules accepted: Orders

## 2023-12-13 NOTE — Telephone Encounter (Signed)
 Spoke with patient, he would like to have the xray ordered.

## 2023-12-13 NOTE — Telephone Encounter (Signed)
 PT. HAS CALLED REQUESTING  A XRAY FOR HIS BACK  NOT SURE IF HE NEED APPT BEFORE AN XRAY  CAN  BE ORDERED

## 2023-12-14 ENCOUNTER — Telehealth: Payer: Self-pay | Admitting: Family Medicine

## 2023-12-14 NOTE — Telephone Encounter (Signed)
 Please notify patient  Letter written printed signed, and sent to mychart.  Let me know if need anything else  Marsa Officer, DO [facility] 12/14/2023, 1:19 PM

## 2023-12-14 NOTE — Telephone Encounter (Signed)
 Patient notified letter is ready for pick up he will come by the office and get it.

## 2023-12-14 NOTE — Telephone Encounter (Signed)
 Pt. Called wanted to know if he could get a work note  stated that he was out  Wednesday, Thursday,and Friday  pt wanted to return to work on Monday.pt also requesting in the letter he had a x-ray. Pt call back # is [phone]

## 2023-12-19 ENCOUNTER — Encounter: Payer: Self-pay | Admitting: Urology

## 2023-12-19 ENCOUNTER — Ambulatory Visit (INDEPENDENT_AMBULATORY_CARE_PROVIDER_SITE_OTHER): Admitting: Urology

## 2023-12-19 ENCOUNTER — Encounter: Payer: Self-pay | Admitting: Family Medicine

## 2023-12-19 VITALS — BP 126/85 | HR 66 | Ht 74.0 in | Wt 225.0 lb

## 2023-12-19 DIAGNOSIS — Z3009 Encounter for other general counseling and advice on contraception: Secondary | ICD-10-CM | POA: Diagnosis not present

## 2023-12-19 NOTE — Patient Instructions (Signed)
 Pre-Vasectomy Instructions  STOP all aspirin or blood thinners (Aspirin, Plavix, Coumadin, Warfarin, Motrin, Ibuprofen, Advil, Aleve, Naproxen, Naprosyn) for 7 days prior to the procedure.  If you have any questions about stopping these medications please contact your primary care physician or cardiologist.  Shave all hair from the upper scrotum on the day of the procedure.  This means just under the penis onto the scrotal sac.  The area shaved should measure about 2-3 inches around.  You may lather the scrotum with soap and water, and shave with a safety razor.  After shaving the area, thoroughly wash the penis and the scrotum, then shower or bathe to remove all the loose hairs.  If needed, wash the area again just before coming in for your Vasectomy.  It is recommended to have a light meal an hour or so prior to the procedure.  Bring a scrotal support (jock strap or suspensory, or tight jockey shorts or underwear).  Wear comfortable pants or shorts.  While the actual procedure usually takes about 45 minutes, you should be prepared to stay in the office for approximately one hour.  Bring someone with you to drive you home.  If you have any questions or concerns, please feel free to call the office at [phone].

## 2023-12-19 NOTE — Progress Notes (Signed)
 12/19/2023 10:21 AM   Samuel Davis [birth year] [id number]  Referring provider: Edman Marsa PARAS, DO [address] [place],  [place] [address]  Chief Complaint  Patient presents with   VAS Consult    HPI: Samuel Davis is a 53 y.o. male who presents for vasectomy counseling.  Single with 2 grown children.  Does not desire additional children Denies prior history urologic problems including chronic scrotal pain, epididymitis or orchitis No previous history inguinal hernia or pelvic surgery No history of bleeding or clotting disorders   PMH: Past Medical History:  Diagnosis Date   Wears dentures    partial upper    Surgical History: Past Surgical History:  Procedure Laterality Date   COLONOSCOPY WITH PROPOFOL  N/A 06/13/2019   Procedure: COLONOSCOPY WITH PROPOFOL ;  Surgeon: Unk Corinn Skiff, MD;  Location: [facility];  Service: Endoscopy;  Laterality: N/A;  Priority 4   THUMB ARTHROSCOPY  05/08/2018   Fracture repair.  [facility]    Home Medications:  Allergies as of 12/19/2023   No Known Allergies      Medication List        Accurate as of December 19, 2023 10:21 AM. If you have any questions, ask your nurse or doctor.          baclofen  10 MG tablet Commonly known as: LIORESAL  Take 0.5-1 tablets (5-10 mg total) by mouth 3 (three) times daily as needed for muscle spasms.   CALCIUM PO Take by mouth.   COLLAGEN PO Take by mouth.   gabapentin  100 MG capsule Commonly known as: NEURONTIN  Start 1 capsule daily, increase by 1 cap every 2-3 days as tolerated up to 3 times a day, or may take 3 at once in evening.   MAGNESIUM PO Take by mouth.   naproxen  500 MG tablet Commonly known as: NAPROSYN  Take 1 tablet (500 mg total) by mouth 2 (two) times daily with a meal. For 2-4 weeks then as needed   sildenafil  20 MG tablet Commonly known as: REVATIO  Take 1-5 pills about 30 min prior to sex. Start with 1 and increase as needed.   VITAMIN D  PO Take by mouth.        Allergies: No Known Allergies  Family History: Family History  Problem Relation Age of Onset   Healthy Mother    Colon cancer Father 60       possibly earlier, but not diagnosed   Hypertension Sister     Social History:  reports that he quit smoking about 6 years ago. His smoking use included cigarettes. He started smoking about 16 years ago. He has a 7.5 pack-year smoking history. He has quit using smokeless tobacco. He reports that he does not currently use alcohol after a past usage of about 7.0 standard drinks of alcohol per week. He reports that he does not use drugs.   Physical Exam: BP 126/85   Pulse 66   Ht 6' 2 (1.88 m)   Wt 225 lb (102.1 kg)   BMI 28.89 kg/m   Constitutional:  Alert and oriented, No acute distress. HEENT: [place] AT Respiratory: Normal respiratory effort, no increased work of breathing. GU: Phallus without lesions, testes descended bilaterally without masses or tenderness, spermatic cord/epididymis palpably normal bilaterally.  Vasa palpable bilaterally Psychiatric: Normal mood and affect.   Assessment & Plan:    1.  Undesired fertility Desires to schedule vasectomy We had a long discussion about vasectomy. We specifically discussed the procedure, recovery and the  risks, benefits and alternatives of vasectomy. I explained that the procedure entails removal of a segment of each vas deferens, each of which conducts sperm, and that the purpose of this procedure is to cause sterility (inability to produce children or cause pregnancy). Vasectomy is intended to be permanent and irreversible form of contraception. Options for fertility after vasectomy include vasectomy reversal, or sperm retrieval with in vitro fertilization. These options are not always successful, and they may be expensive. We discussed reversible forms of birth control such as condoms, IUD or diaphragms, as well as the option of freezing sperm in a sperm bank prior to  the vasectomy procedure. We discussed the importance of avoiding strenuous exercise for four days after vasectomy, and the importance of refraining from any form of ejaculation for seven days after vasectomy. I explained that vasectomy does not produce immediate sterility so another form of contraceptive must be used until sterility is assured by having semen checked for sperm. Thus, a post vasectomy semen analysis is necessary to confirm sterility. Rarely, vasectomy must be repeated. We discussed the approximately 1 in 2,000 risk of pregnancy after vasectomy for men who have post-vasectomy semen analysis showing absent sperm or rare non-motile sperm. Typical side effects include a small amount of oozing blood, some discomfort and mild swelling in the area of incision.  Vasectomy does not affect sexual performance, function, please, sensation, interest, desire, satisfaction, penile erection, volume of semen or ejaculation. Other rare risks include allergy or adverse reaction to an anesthetic, testicular atrophy, hematoma, infection/abscess, prolonged tenderness of the vas deferens, pain, swelling, painful nodule or scar (called sperm granuloma) or epididymtis. We discussed chronic testicular pain syndrome. This has been reported to occur in as many as 1-2% of men and may be permanent. This can be treated with medication, small procedures or (rarely) surgery. He indicated he would call back if he desires a preprocedure anxiolytic and would need a driver if utilizing   Samuel JAYSON Barba, MD  [facility] [address] [place], [place] [address] [phone]

## 2023-12-20 DIAGNOSIS — Z0279 Encounter for issue of other medical certificate: Secondary | ICD-10-CM

## 2023-12-24 ENCOUNTER — Ambulatory Visit: Payer: Self-pay | Admitting: Family Medicine

## 2024-01-14 ENCOUNTER — Ambulatory Visit

## 2024-01-14 ENCOUNTER — Telehealth: Payer: Self-pay

## 2024-01-14 NOTE — Telephone Encounter (Signed)
 Copied from CRM [id number]. Topic: Appointments - Scheduling Inquiry for Clinic >> Jan 14, 2024 12:37 PM Charlet HERO wrote: Reason for CRM: Patient missed appt for this morning and he wants to know if he can just walk in and get the shot. Please call patient back to inform if he needs to schedule appt or can just walk in.

## 2024-01-15 ENCOUNTER — Ambulatory Visit

## 2024-01-15 DIAGNOSIS — Z23 Encounter for immunization: Secondary | ICD-10-CM

## 2024-03-05 ENCOUNTER — Encounter: Admitting: Urology

## 2024-04-11 ENCOUNTER — Encounter: Admitting: Urology

## 2024-04-15 ENCOUNTER — Encounter: Admitting: Urology

## 2024-04-17 ENCOUNTER — Telehealth: Payer: Self-pay

## 2024-04-17 NOTE — Telephone Encounter (Signed)
 Appointment made for patient to see DR Edman

## 2024-04-17 NOTE — Telephone Encounter (Signed)
 Copied from CRM #[id number]. Topic: Appointments - Scheduling Inquiry for Clinic >> Apr 17, 2024  9:39 AM Samuel Davis wrote: Reason for CRM: Pt needs a cortisol shot, please advise   Best contact: [phone]

## 2024-04-18 ENCOUNTER — Ambulatory Visit (INDEPENDENT_AMBULATORY_CARE_PROVIDER_SITE_OTHER): Admitting: Family Medicine

## 2024-04-18 ENCOUNTER — Ambulatory Visit: Payer: Self-pay

## 2024-04-18 ENCOUNTER — Encounter: Payer: Self-pay | Admitting: Family Medicine

## 2024-04-18 VITALS — BP 156/96 | HR 89 | Temp 98.5°F | Ht 74.0 in | Wt 245.0 lb

## 2024-04-18 DIAGNOSIS — D509 Iron deficiency anemia, unspecified: Secondary | ICD-10-CM | POA: Diagnosis not present

## 2024-04-18 DIAGNOSIS — K2900 Acute gastritis without bleeding: Secondary | ICD-10-CM

## 2024-04-18 MED ORDER — ONDANSETRON 4 MG PO TBDP: 4.0000 mg | ORAL_TABLET | Freq: Three times a day (TID) | ORAL | 0 refills | Status: AC | PRN

## 2024-04-18 MED ORDER — PANTOPRAZOLE SODIUM 40 MG PO TBEC: 40.0000 mg | DELAYED_RELEASE_TABLET | Freq: Every day | ORAL | 0 refills | Status: AC

## 2024-04-18 NOTE — Telephone Encounter (Signed)
 FYI Only or Action Required?: FYI only for provider: appointment scheduled on 04/18/24.  Patient was last seen in primary care on 10/30/2023 by Edman Marsa PARAS, DO.  Called Nurse Triage reporting Diarrhea, Abdominal Pain, and Bloated.  Symptoms began 2 months ago.  Interventions attempted: Nothing.  Symptoms are: gradually worsening.  Triage Disposition: See HCP Within 4 Hours (Or PCP Triage)  Patient/caregiver understands and will follow disposition?: Yes            Copied from CRM [id number]. Topic: Clinical - Red Word Triage >> Apr 18, 2024 11:31 AM Myrick T wrote: Red Word that prompted transfer to Nurse Triage: patient said stomach pain has gotten worse over the last month. He says there is swelling in his stomach along with diarrhea.   ----------------------------------------------------------------------- From previous Reason for Contact - Scheduling: Patient/patient representative is calling to schedule an appointment. Refer to attachments for appointment information. Reason for Disposition  [1] MILD-MODERATE pain AND [2] constant AND [3] present > 2 hours  Answer Assessment - Initial Assessment Questions 1. LOCATION: Where does it hurt?      Center.  2. RADIATION: Does the pain shoot anywhere else? (e.g., chest, back)     No.  3. ONSET: When did the pain begin? (Minutes, hours or days ago)      X 2 months. Gradually worsening x 1 month.  4. SUDDEN: Gradual or sudden onset?     Gradually worsening.  5. PATTERN Does the pain come and go, or is it constant?     Constant.  6. SEVERITY: How bad is the pain?  (e.g., Scale 1-10; mild, moderate, or severe)     5/10.  7. RECURRENT SYMPTOM: Have you ever had this type of stomach pain before? If Yes, ask: When was the last time? and What happened that time?      No. He states he had a colonoscopy 5 years ago and has had diarrhea and blood in his stool before but not the pain or swelling  like this.  8. CAUSE: What do you think is causing the stomach pain? (e.g., gallstones, recent abdominal surgery)     Unsure.  9. RELIEVING/AGGRAVATING FACTORS: What makes it better or worse? (e.g., antacids, bending or twisting motion, bowel movement)     Relieves when urinating or having bowel movement.  10. OTHER SYMPTOMS: Do you have any other symptoms? (e.g., back pain, diarrhea, fever, urination pain, vomiting)       Fatigue, low energy, increased flatulence, diarrhea on and off for a couple weeks (3 episodes in last 24 hours, loose/runny stool), some blood on surface of bowel movement, constipation,  abdomen feels tight/bloated, once when at [facility] had a dizzy spell and resolved/not present now. No syncope or LOC, vomiting, black tarry BM, urinary retention  Protocols used: Abdominal Pain - Male-A-AH

## 2024-04-18 NOTE — Patient Instructions (Signed)
 VISIT SUMMARY:  Today, you were seen for persistent abdominal discomfort, diarrhea, and fatigue that have been ongoing for two months. We discussed your symptoms, supplement use, and dietary changes. We also reviewed your chronic joint pain and functional status.  YOUR PLAN:  GASTRITIS: You have been experiencing abdominal symptoms likely due to gastritis, which may be worsened by NSAID use and diet. -You have been prescribed pantoprazole  to help with your symptoms. Take one pill daily on empty stomach. -You have been prescribed ondansetron  to use as needed for nausea. -You can use simethicone  as needed for bloating and gas. -Avoid NSAIDs completely. You can use acetaminophen  for pain control. -Use topical analgesics for joint pain, but avoid topical NSAIDs. -Make dietary changes to avoid heavy, greasy, and fried foods. Gradually reintroduce these foods if tolerated. -Limit alcohol and caffeine intake. -Be aware of red flag symptoms and seek emergency care if needed. -Rest as much as possible and use the work note provided.  ANEMIA: You have microcytic anemia, likely due to gastrointestinal blood loss or malabsorption. -You have been referred to gastroenterology for further evaluation, which may include an endoscopy. -It is important to attend your GI appointment.

## 2024-04-18 NOTE — Progress Notes (Signed)
 "    Primary Care / Sports Medicine Office Visit  Patient Information:  Patient ID: Samuel Davis, male DOB: 1971-02-20 Age: 54 y.o. MRN: [id number]   Samuel Davis is a pleasant 54 y.o. male presenting with the following:  Chief Complaint  Patient presents with   Abdominal Pain    abdominal pain located in the center, x 1 month, bloated feeling all the time, no vomiting, diarrhea     Vitals:   04/18/24 1328  BP: (!) 156/96  Pulse: 89  Temp: 98.5 F (36.9 C)  SpO2: 95%   Vitals:   04/18/24 1328  Weight: 245 lb (111.1 kg)  Height: 6' 2 (1.88 m)   Body mass index is 31.46 kg/m.  No results found.   Discussed the use of AI scribe software for clinical note transcription with the patient, who gave verbal consent to proceed.   Independent interpretation of notes and tests performed by another provider:   None  Procedures performed:   None  Pertinent History, Exam, Impression, and Recommendations:   History of Present Illness Samuel Davis is a 54 year old male with microcytic anemia and chronic joint pain who presents with two months of persistent abdominal discomfort, diarrhea, and fatigue.  Abdominal Symptoms: - Persistent daily abdominal tightness and soreness for 1-2 months - Associated bloating and sensation of abdominal fullness - Intermittent diarrhea and occasional nausea - No symptom-free days during this period  Constitutional Symptoms: - Significant fatigue with markedly decreased energy levels - Fatigue limits ability to perform daily activities, work, and exercise - Unintentional weight gain of 20 lbs (from 225 lbs to 245 lbs) over 1-2 months    04/18/2024    1:28 PM 12/19/2023    9:41 AM 10/30/2023    8:15 AM  Vitals with BMI  Height 6' 2 6' 2 6' 2  Weight 245 lbs 225 lbs 221 lbs 6 oz  BMI 31.44 28.88 28.41  Systolic 156 126 867  Diastolic 96 85 80  Pulse 89 66 94   Supplement Use and Dietary Changes: - Initiated a new A1c-lowering  gummy supplement approximately two months ago, uncertain which one - Inconsistent use of magnesium, turmeric, beetroot, fish oil, and zinc supplements - Reduced protein powder intake due to bloating - No other significant dietary or supplement changes  Chronic Arthralgias: - Chronic generalized joint pain, particularly in both shoulders and knees - Known shoulder arthritis - Previously used NSAIDs and BC Powder for pain, recently switched to acetaminophen  1 week ago, but otherwise describes heavy usage  Functional Status: - Works two jobs, including night shifts - Reduced work hours due to fatigue - Attempts to exercise but limited by low energy  Colorectal Cancer Screening: - Underwent colonoscopy five years ago - Due for repeat screening in the coming months  Physical Exam MEASUREMENTS: Weight- 245. ABDOMEN: Abdomen is soft, non-tender, mildly distended, and not tympanic. Normoactive bowel sounds without high-pitched noises. No hepatosplenomegaly.  Results Labs CBC: Microcytic anemia  Latest Reference Range & Units 12/19/19 09:58 01/27/21 08:57 10/16/22 18:32 11/08/22 07:54 03/21/23 02:52 10/30/23 09:27  WBC 3.8 - 10.8 Thousand/uL 3.1 (L) 3.9 8.5 4.2 5.2 5.5  RBC 4.20 - 5.80 Million/uL 5.58 5.60 5.92 (H) 5.19 5.69 5.82 (H)  Hemoglobin 13.2 - 17.1 g/dL 87.2 (L) 87.0 (L) 86.4 11.8 (L) 13.3 13.6  HCT 38.5 - 50.0 % 42.2 42.4 43.9 39.4 41.8 44.6  MCV 80.0 - 100.0 fL 75.6 (L) 75.7 (L) 74.2 (L) 75.9 (L) 73.5 (  L) 76.6 (L)  MCH 27.0 - 33.0 pg 22.8 (L) 23.0 (L) 22.8 (L) 22.7 (L) 23.4 (L) 23.4 (L)  MCHC 32.0 - 36.0 g/dL 69.8 (L) 69.5 (L) 69.1 29.9 (L) 31.8 30.5 (L)  RDW 11.0 - 15.0 % 15.3 (H) 14.9 14.9 14.7 14.4 15.4 (H)  Platelets 140 - 400 Thousand/uL 163 180 276 173 256 153  MPV 7.5 - 12.5 fL 10.4 11.2  10.1  10.3  nRBC 0.0 - 0.2 %   0.0  0.0   (L): Data is abnormally low (H): Data is abnormally high  Assessment and Plan Gastritis Subacute to chronic abdominal symptoms likely due  to gastritis exacerbated by NSAID use and diet. Concern for underlying gastrointestinal pathology. Expected improvement with acid suppression and dietary changes; risk of progression or bleeding if untreated. - Prescribed pantoprazole , 30 pills provided. - Prescribed ondansetron  as needed for nausea. - Recommended simethicone  as needed for bloating and gas. - Advised strict NSAID avoidance. - Permitted acetaminophen  for pain control. - Recommended topical analgesics for musculoskeletal pain, avoiding topical NSAIDs. - Advised dietary modifications to avoid heavy, greasy, fried foods, with gradual reintroduction. - Advised limiting alcohol and caffeine. - Provided guidance on red flag symptoms and emergency care. - Provided work note and recommended rest.  Microcytic anemia Microcytic anemia with chronic low blood counts, possibly due to gastrointestinal blood loss. Requires further evaluation. - Referred to gastroenterology for evaluation, including possible endoscopy. - Emphasized importance of attending GI appointment. - Reviewed prior colonoscopy history and will coordinate with gastroenterology.  Problem List Items Addressed This Visit     Microcytic anemia   Relevant Orders   Ambulatory referral to Gastroenterology   Other Visit Diagnoses       Acute gastritis without hemorrhage, unspecified gastritis type    -  Primary   Relevant Medications   pantoprazole  (PROTONIX ) 40 MG tablet   ondansetron  (ZOFRAN -ODT) 4 MG disintegrating tablet   Other Relevant Orders   Ambulatory referral to Gastroenterology        Orders & Medications Medications:  Meds ordered this encounter  Medications   pantoprazole  (PROTONIX ) 40 MG tablet    Sig: Take 1 tablet (40 mg total) by mouth daily.    Dispense:  30 tablet    Refill:  0   ondansetron  (ZOFRAN -ODT) 4 MG disintegrating tablet    Sig: Take 1 tablet (4 mg total) by mouth every 8 (eight) hours as needed for up to 10 days for nausea or  vomiting.    Dispense:  20 tablet    Refill:  0   Orders Placed This Encounter  Procedures   Ambulatory referral to Gastroenterology     No follow-ups on file.     Selinda JINNY Ku, MD, [facility]   Primary Care Sports Medicine Primary Care and Sports Medicine at [facility]   "

## 2024-04-22 ENCOUNTER — Ambulatory Visit: Admitting: Family Medicine

## 2024-04-25 ENCOUNTER — Telehealth: Payer: Self-pay

## 2024-04-25 ENCOUNTER — Encounter: Payer: Self-pay | Admitting: Family Medicine

## 2024-04-25 ENCOUNTER — Ambulatory Visit: Admitting: Family Medicine

## 2024-04-25 ENCOUNTER — Other Ambulatory Visit: Payer: Self-pay | Admitting: Family Medicine

## 2024-04-25 VITALS — BP 136/84 | HR 66 | Ht 74.0 in | Wt 243.4 lb

## 2024-04-25 DIAGNOSIS — D509 Iron deficiency anemia, unspecified: Secondary | ICD-10-CM

## 2024-04-25 DIAGNOSIS — M51362 Other intervertebral disc degeneration, lumbar region with discogenic back pain and lower extremity pain: Secondary | ICD-10-CM | POA: Diagnosis not present

## 2024-04-25 DIAGNOSIS — G8929 Other chronic pain: Secondary | ICD-10-CM | POA: Diagnosis not present

## 2024-04-25 DIAGNOSIS — M545 Low back pain, unspecified: Secondary | ICD-10-CM

## 2024-04-25 DIAGNOSIS — Z800 Family history of malignant neoplasm of digestive organs: Secondary | ICD-10-CM

## 2024-04-25 DIAGNOSIS — R7309 Other abnormal glucose: Secondary | ICD-10-CM

## 2024-04-25 DIAGNOSIS — Z0000 Encounter for general adult medical examination without abnormal findings: Secondary | ICD-10-CM

## 2024-04-25 DIAGNOSIS — Z1211 Encounter for screening for malignant neoplasm of colon: Secondary | ICD-10-CM

## 2024-04-25 DIAGNOSIS — E7800 Pure hypercholesterolemia, unspecified: Secondary | ICD-10-CM

## 2024-04-25 DIAGNOSIS — Z125 Encounter for screening for malignant neoplasm of prostate: Secondary | ICD-10-CM

## 2024-04-25 NOTE — Progress Notes (Signed)
 "  Subjective:    Patient ID: Samuel Davis, male    DOB: 1970/12/27, 54 y.o.   MRN: [id number]  Samuel Davis is a 54 y.o. male presenting on 04/25/2024 for Medical Management of Chronic Issues (Patient wants shot in his back if possible.) and Back Pain   HPI  Discussed the use of AI scribe software for clinical note transcription with the patient, who gave verbal consent to proceed.  History of Present Illness   Samuel Davis is a 54 year old male with moderate arthritis and disc disease in the lumbar spine who presents with lower back pain.  Chronic Low Back Pain Lumbar spine pain - Persistent lower back pain associated with moderate arthritis and disc disease in the lumbar spine - No prior history of spinal or back injections; inquiring about possibility of receiving one - Has tried conservative treatments including medications, anti-inflammatories, topical treatments, chiropractic care, and physical therapy, but pain persists Lumbar Spine X-ray shows moderate degenerative disc at lower lumbar L5 S1 region  Radicular symptoms - Episodes of sciatica with pain radiating down the leg, lasting up to three weeks at a time - Pain described as 'pinching' on the nerve - No current use of cane, but would consider if necessary - No current symptoms of nerve damage in the lower extremities - Back pain has previously radiated down the leg, resembling sciatica  Weight gain and gastrointestinal symptoms - Recent weight gain of approximately 20 pounds, previously around 156 pounds - Attributes some weight gain to the holidays and is working on [employer] management - Recent onset of gas and bloating       Due Colonoscopy 06/2024 Already has referral in system, prior 2021 colonoscopy, has fam history colon ca, needs screening every 5 year     04/25/2024    9:53 AM 04/25/2024    9:11 AM 10/30/2023    8:17 AM  Depression screen PHQ 2/9  Decreased Interest 0 0 0  Down, Depressed, Hopeless 0 0  0  PHQ - 2 Score 0 0 0  Altered sleeping 0  0  Tired, decreased energy 0  0  Change in appetite 0  0  Feeling bad or failure about yourself  0  0  Trouble concentrating 0  0  Moving slowly or fidgety/restless 0  0  Suicidal thoughts 0  0  PHQ-9 Score 0  0   Difficult doing work/chores Not difficult at all       Data saved with a previous flowsheet row definition       04/25/2024    9:53 AM 10/30/2023    8:17 AM 10/26/2023   11:20 AM 09/18/2023   10:32 AM  GAD 7 : Generalized Anxiety Score  Nervous, Anxious, on Edge 0 0 0 0  Control/stop worrying 0 0 0 0  Worry too much - different things 0 0 0 0  Trouble relaxing 0 0 0 0  Restless 0 0 0 0  Easily annoyed or irritable 0 0 0 0  Afraid - awful might happen 0 0 0 0  Total GAD 7 Score 0 0 0 0  Anxiety Difficulty Not difficult at all  Not difficult at all     Social History[1]  Review of Systems Per HPI unless specifically indicated above     Objective:    BP 136/84 (BP Location: Left Arm, Cuff Size: Normal)   Pulse 66   Ht 6' 2 (1.88 m)   Wt 243 lb 6.4  oz (110.4 kg)   SpO2 97%   BMI 31.25 kg/m   Wt Readings from Last 3 Encounters:  04/25/24 243 lb 6.4 oz (110.4 kg)  04/18/24 245 lb (111.1 kg)  12/19/23 225 lb (102.1 kg)    Physical Exam Vitals and nursing note reviewed.  Constitutional:      General: He is not in acute distress.    Appearance: He is well-developed. He is not diaphoretic.     Comments: Well-appearing, comfortable, cooperative  HENT:     Head: Normocephalic and atraumatic.  Eyes:     General:        Right eye: No discharge.        Left eye: No discharge.     Conjunctiva/sclera: Conjunctivae normal.  Neck:     Thyroid: No thyromegaly.  Cardiovascular:     Rate and Rhythm: Normal rate and regular rhythm.     Pulses: Normal pulses.     Heart sounds: Normal heart sounds. No murmur heard. Pulmonary:     Effort: Pulmonary effort is normal. No respiratory distress.     Breath sounds: Normal  breath sounds. No wheezing or rales.  Musculoskeletal:        General: Normal range of motion.     Cervical back: Normal range of motion and neck supple.     Comments: Bilateral low back muscle hypertonicity spasm  Lymphadenopathy:     Cervical: No cervical adenopathy.  Skin:    General: Skin is warm and dry.     Findings: No erythema or rash.  Neurological:     Mental Status: He is alert and oriented to person, place, and time. Mental status is at baseline.  Psychiatric:        Behavior: Behavior normal.     Comments: Well groomed, good eye contact, normal speech and thoughts     I have personally reviewed the radiology report from 12/13/23 on Lumbar X-ray.  CLINICAL DATA:  Chronic low back pain.   EXAM: DG LUMBAR SPINE COMPLETE 4+V   COMPARISON:  None Available.   FINDINGS: There is no evidence of lumbar spine fracture. Alignment is normal. There is moderate disc space narrowing and endplate osteophyte formation at L5-S1 compatible with degenerative change. Disc spaces are otherwise well maintained.   IMPRESSION: Moderate degenerative disc disease at L5-S1.     Electronically Signed   By: Greig Pique M.D.   On: 12/21/2023 23:08  Results for orders placed or performed in visit on 10/30/23  Lipid panel   Collection Time: 10/30/23  9:27 AM  Result Value Ref Range   Cholesterol 270 (H) <200 mg/dL   HDL 83 > OR = 40 mg/dL   Triglycerides 871 <849 mg/dL   LDL Cholesterol (Calc) 162 (H) mg/dL (calc)   Total CHOL/HDL Ratio 3.3 <5.0 (calc)   Non-HDL Cholesterol (Calc) 187 (H) <130 mg/dL (calc)  Hemoglobin J8r   Collection Time: 10/30/23  9:27 AM  Result Value Ref Range   Hgb A1c MFr Bld 6.1 (H) <5.7 %   Mean Plasma Glucose 128 mg/dL   eAG (mmol/L) 7.1 mmol/L  CBC with Differential/Platelet   Collection Time: 10/30/23  9:27 AM  Result Value Ref Range   WBC 5.5 3.8 - 10.8 Thousand/uL   RBC 5.82 (H) 4.20 - 5.80 Million/uL   Hemoglobin 13.6 13.2 - 17.1 g/dL   HCT  55.3 61.4 - 49.9 %   MCV 76.6 (L) 80.0 - 100.0 fL   MCH 23.4 (L) 27.0 - 33.0 pg  MCHC 30.5 (L) 32.0 - 36.0 g/dL   RDW 84.5 (H) 88.9 - 84.9 %   Platelets 153 140 - 400 Thousand/uL   MPV 10.3 7.5 - 12.5 fL   Neutro Abs 3,317 1,500 - 7,800 cells/uL   Absolute Lymphocytes 1,595 850 - 3,900 cells/uL   Absolute Monocytes 501 200 - 950 cells/uL   Eosinophils Absolute 61 15 - 500 cells/uL   Basophils Absolute 28 0 - 200 cells/uL   Neutrophils Relative % 60.3 %   Total Lymphocyte 29.0 %   Monocytes Relative 9.1 %   Eosinophils Relative 1.1 %   Basophils Relative 0.5 %  PSA   Collection Time: 10/30/23  9:27 AM  Result Value Ref Range   PSA 0.63 < OR = 4.00 ng/mL  Comprehensive metabolic panel with GFR   Collection Time: 10/30/23  9:27 AM  Result Value Ref Range   Glucose, Bld 113 (H) 65 - 99 mg/dL   BUN 23 7 - 25 mg/dL   Creat 9.05 9.29 - 8.69 mg/dL   eGFR 98 > OR = 60 fO/fpw/8.26f7   BUN/Creatinine Ratio SEE NOTE: 6 - 22 (calc)   Sodium 141 135 - 146 mmol/L   Potassium 4.2 3.5 - 5.3 mmol/L   Chloride 106 98 - 110 mmol/L   CO2 27 20 - 32 mmol/L   Calcium 9.7 8.6 - 10.3 mg/dL   Total Protein 7.0 6.1 - 8.1 g/dL   Albumin 4.6 3.6 - 5.1 g/dL   Globulin 2.4 1.9 - 3.7 g/dL (calc)   AG Ratio 1.9 1.0 - 2.5 (calc)   Total Bilirubin 0.4 0.2 - 1.2 mg/dL   Alkaline phosphatase (APISO) 37 35 - 144 U/L   AST 39 (H) 10 - 35 U/L   ALT 55 (H) 9 - 46 U/L  Varicella zoster antibody, IgG   Collection Time: 10/30/23  9:27 AM  Result Value Ref Range   Varicella IgG 35.40 S/CO  TSH   Collection Time: 10/30/23  9:27 AM  Result Value Ref Range   TSH 1.03 0.40 - 4.50 mIU/L      Assessment & Plan:   Problem List Items Addressed This Visit   None Visit Diagnoses       Degeneration of intervertebral disc of lumbar region with discogenic back pain and lower extremity pain    -  Primary   Relevant Orders   Ambulatory referral to Physical Medicine Rehab     Chronic bilateral low back pain without  sciatica       Relevant Orders   Ambulatory referral to Physical Medicine Rehab     Screening for colon cancer         Family history of colon cancer            Lumbar disc degeneration with chronic low back pain and lower extremity pain Chronic low back pain with moderate arthritis and disc disease at L5-S1, consistent with sciatica. Conservative treatments attempted. Considering spinal injections for further management. Discussed benefits, limitations, and risks of spinal injections. - Referred to physiatrist or pain management specialist for spinal injections. - Likely need further MRI lumbar spine imaging in future, will defer at this time pending consultation - Continue conservative management with medications and physical therapy as tolerated  Screening for colon cancer Colonoscopy in 2021 showed no abnormalities. Family history of colon cancer noted. Next screening due in March 2026. - Already has referral in - pending scheduling with  GI for consult given history of anemia and recent  referral already placed. - Anticipate colonoscopy for March 2026.  Obesity Recent weight gain noted. Discussed importance of weight management for overall health and impact on back pain.  Elevated blood pressure without HTN Blood pressure initially elevated, improved after rest. Discussed importance of monitoring and managing blood pressure.  No change to therapy today, focus on improving back pain as this can raise BP        Referral to [facility] for [facility] spinal injections  Orders Placed This Encounter  Procedures   Ambulatory referral to Physical Medicine Rehab    Referral Priority:   Routine    Referral Type:   Rehabilitation    Referral Reason:   Specialty Services Required    Requested Specialty:   Physical Medicine and Rehabilitation    Number of Visits Requested:   1    No orders of the defined types were placed in this encounter.   Follow up plan: Return for  6 month fasting lab > 1 week later Annual Physical.  Future labs ordered for 10/24/24  Marsa Officer, DO [facility] 04/25/2024, 8:43 AM     [1]  Social History Tobacco Use   Smoking status: Former    Current packs/day: 0.00    Average packs/day: 0.8 packs/day for 10.0 years (7.5 ttl pk-yrs)    Types: Cigarettes    Start date: 09/09/2007    Quit date: 09/08/2017    Years since quitting: 6.6   Smokeless tobacco: Former   Tobacco comments:    Quit cold turkey  [employer]   Vaping status: Never Used  Substance Use Topics   Alcohol use: Not Currently    Alcohol/week: 7.0 standard drinks of alcohol    Types: 7 Cans of beer per week   Drug use: Never   "

## 2024-04-25 NOTE — Patient Instructions (Addendum)
 Thank you for coming to the office today.  Recommend picking up Radiology Images from 12/13/23 Back Lumbar X-ray from [facility] Radiology Department.  Referral to [facility] for Back Pain / Arthritis and Back / Spinal Injection evaluation.  [facility] ([facility]) [address] - [address] [place], [place] [address] Phone: [phone]   DUE for FASTING BLOOD WORK (no food or drink after midnight before the lab appointment, only water  or coffee without cream/sugar on the morning of)  SCHEDULE Lab Only visit in the morning at the clinic for lab draw in 6 MONTHS   - Make sure Lab Only appointment is at about 1 week before your next appointment, so that results will be available  For Lab Results, once available within 2-3 days of blood draw, you can can log in to MyChart online to view your results and a brief explanation. Also, we can discuss results at next follow-up visit.   Please schedule a Follow-up Appointment to: Return for 6 month fasting lab > 1 week later Annual Physical.  If you have any other questions or concerns, please feel free to call the office or send a message through MyChart. You may also schedule an earlier appointment if necessary.  Additionally, you may be receiving a survey about your experience at our office within a few days to 1 week by e-mail or mail. We value your feedback.  Marsa Officer, DO [facility], [place]

## 2024-04-25 NOTE — Telephone Encounter (Signed)
 Copied from CRM [id number]. Topic: Clinical - Lab/Test Results >> Apr 24, 2024  4:29 PM Myrick T wrote: Reason for CRM: patient called requesting to have his DG Lumbar Spine Complete images taken on 9/4 sent to [address], [place], [place] [address]; phn#[phone]

## 2024-07-03 ENCOUNTER — Ambulatory Visit: Admitting: Gastroenterology

## 2024-10-24 ENCOUNTER — Other Ambulatory Visit

## 2024-10-31 ENCOUNTER — Ambulatory Visit: Admitting: Family Medicine
